# Patient Record
Sex: Male | Born: 1950 | Race: White | Hispanic: No | Marital: Married | Smoking: Former smoker
Health system: Southern US, Community
[De-identification: ages and names within clinical notes are randomized; demographics above are authoritative.]

## PROBLEM LIST (undated history)

## (undated) DIAGNOSIS — J189 Pneumonia, unspecified organism: Secondary | ICD-10-CM

## (undated) DIAGNOSIS — I10 Essential (primary) hypertension: Secondary | ICD-10-CM

## (undated) DIAGNOSIS — D860 Sarcoidosis of lung: Secondary | ICD-10-CM

## (undated) DIAGNOSIS — N400 Enlarged prostate without lower urinary tract symptoms: Secondary | ICD-10-CM

## (undated) HISTORY — DX: Essential (primary) hypertension: I10

## (undated) HISTORY — PX: HIP SURGERY: SHX245

## (undated) HISTORY — PX: JOINT REPLACEMENT: SHX530

---

## 1998-05-25 HISTORY — PX: COLONOSCOPY: SHX174

## 1999-05-26 HISTORY — PX: HERNIA REPAIR: SHX51

## 2014-10-02 DIAGNOSIS — H61813 Exostosis of external canal, bilateral: Secondary | ICD-10-CM | POA: Insufficient documentation

## 2014-10-02 DIAGNOSIS — H9193 Unspecified hearing loss, bilateral: Secondary | ICD-10-CM | POA: Insufficient documentation

## 2015-07-29 DIAGNOSIS — M1611 Unilateral primary osteoarthritis, right hip: Secondary | ICD-10-CM | POA: Insufficient documentation

## 2015-07-29 DIAGNOSIS — Z96641 Presence of right artificial hip joint: Secondary | ICD-10-CM | POA: Insufficient documentation

## 2015-12-21 DIAGNOSIS — N138 Other obstructive and reflux uropathy: Secondary | ICD-10-CM | POA: Insufficient documentation

## 2015-12-21 DIAGNOSIS — R351 Nocturia: Secondary | ICD-10-CM | POA: Insufficient documentation

## 2015-12-21 DIAGNOSIS — N401 Enlarged prostate with lower urinary tract symptoms: Secondary | ICD-10-CM

## 2016-12-16 DIAGNOSIS — D869 Sarcoidosis, unspecified: Secondary | ICD-10-CM | POA: Insufficient documentation

## 2017-06-11 ENCOUNTER — Encounter: Payer: Self-pay | Admitting: Urology

## 2017-06-11 ENCOUNTER — Ambulatory Visit (INDEPENDENT_AMBULATORY_CARE_PROVIDER_SITE_OTHER): Payer: Medicare Other | Admitting: Urology

## 2017-06-11 VITALS — BP 146/82 | HR 97 | Ht 67.0 in | Wt 178.0 lb

## 2017-06-11 DIAGNOSIS — R972 Elevated prostate specific antigen [PSA]: Secondary | ICD-10-CM | POA: Diagnosis not present

## 2017-06-11 NOTE — Progress Notes (Signed)
06/11/2017 1:34 PM   Florestine Avers 12-10-1950 [id number]  Referring provider: Jerl Mina, MD [address] [facility] [place], [place] [address]  Chief Complaint  Patient presents with  . Elevated PSA         HPI: The patient is a 67 year old gentleman who presents today for evaluation of elevated PSA.  His PSA was 4.98 in January 2019.  It was 4.01 in June 2018.  No previous history of elevated PSAs.  No family history of prostate cancer.  Patient voids in a significant voiding issues with his minor nocturia.  No gross hematuria or nephrolithiasis.  He has no complaints at this time.   PMH: Past Medical History:  Diagnosis Date  . Hypertension     Surgical History: None   Home Medications:  Allergies as of 06/11/2017   No Known Allergies     Medication List        Accurate as of 06/11/17  1:34 PM. Always use your most recent med list.          aspirin EC 81 MG tablet Take by mouth.   lisinopril 10 MG tablet Commonly known as:  PRINIVIL,ZESTRIL Take by mouth.       Allergies: No Known Allergies  Family History: No family history on file.  Social History:  reports that he has quit smoking. he has never used smokeless tobacco. He reports that he drinks alcohol. He reports that he does not use drugs.  ROS: UROLOGY Frequent Urination?: Yes Hard to postpone urination?: No Burning/pain with urination?: No Get up at night to urinate?: Yes Leakage of urine?: No Urine stream starts and stops?: No Trouble starting stream?: No Do you have to strain to urinate?: No Blood in urine?: No Urinary tract infection?: No Sexually transmitted disease?: No Injury to kidneys or bladder?: No Painful intercourse?: No Weak stream?: No Erection problems?: No Penile pain?: No  Gastrointestinal Nausea?: No Vomiting?: No Indigestion/heartburn?: No Diarrhea?: No Constipation?: No  Constitutional Fever: No Night sweats?: No Weight loss?:  No Fatigue?: No  Skin Skin rash/lesions?: No Itching?: No  Eyes Blurred vision?: No Double vision?: No  Ears/Nose/Throat Sore throat?: Yes Sinus problems?: Yes  Hematologic/Lymphatic Swollen glands?: No Easy bruising?: No  Cardiovascular Leg swelling?: No Chest pain?: No  Respiratory Cough?: No Shortness of breath?: Yes  Endocrine Excessive thirst?: No  Musculoskeletal Back pain?: No Joint pain?: No  Neurological Headaches?: No Dizziness?: No  Psychologic Depression?: No Anxiety?: No  Physical Exam: BP (!) 146/82   Pulse 97   Ht 5\' 7"  (1.702 m)   Wt 178 lb (80.7 kg)   BMI 27.88 kg/m   Constitutional:  Alert and oriented, No acute distress. HEENT: [place] AT, moist mucus membranes.  Trachea midline, no masses. Cardiovascular: No clubbing, cyanosis, or edema. Respiratory: Normal respiratory effort, no increased work of breathing. GI: Abdomen is soft, nontender, nondistended, no abdominal masses GU: No CVA tenderness.  Normal phallus.  Testicles and equal bilaterally.  Benign.  DRE: 2+ benign Skin: No rashes, bruises or suspicious lesions. Lymph: No cervical or inguinal adenopathy. Neurologic: Grossly intact, no focal deficits, moving all 4 extremities. Psychiatric: Normal mood and affect.  Laboratory Data: No results found for: WBC, HGB, HCT, MCV, PLT  No results found for: CREATININE  No results found for: PSA  No results found for: TESTOSTERONE  No results found for: HGBA1C  Urinalysis No results found for: COLORURINE, APPEARANCEUR, LABSPEC, PHURINE, GLUCOSEU, HGBUR, BILIRUBINUR, KETONESUR, PROTEINUR, UROBILINOGEN, NITRITE, LEUKOCYTESUR  Assessment & Plan:    1.  Elevated PSA I discussed with the patient he has had an elevated PSA on 2 occasions over the last 6 months.  We discussed the next step would be a prostate biopsy.  We discussed the risk, benefits, indications of this procedure.  He understands the risks include but are not limited to  bleeding and infection.  He knows that there will be blood in his urine and stool for 48 hours and his semen for up to 6 weeks.  He does also understand the risk of infection 1% requiring IV antibiotics in the hospital despite periprocedural antibiotics.  All questions were answered.    However, the patient is hesitant to commit to a prostate biopsy at this time.  He has elected instead to monitor his PSA with a repeat PSA in 3 months.  If it continues to rise, he will consider a prostate biopsy at that time  Return in about 3 months (around 09/09/2017) for PSA prior.  Hildred LaserBrian James Sang Blount, MD  [facility] [address], [address] [place], [place] [address] [phone]

## 2017-09-06 ENCOUNTER — Other Ambulatory Visit: Payer: Medicare Other

## 2017-09-06 DIAGNOSIS — R972 Elevated prostate specific antigen [PSA]: Secondary | ICD-10-CM

## 2017-09-07 LAB — PSA: Prostate Specific Ag, Serum: 4.4 ng/mL — ABNORMAL HIGH (ref 0.0–4.0)

## 2017-09-09 ENCOUNTER — Encounter: Payer: Self-pay | Admitting: Urology

## 2017-09-09 ENCOUNTER — Ambulatory Visit (INDEPENDENT_AMBULATORY_CARE_PROVIDER_SITE_OTHER): Payer: Medicare Other | Admitting: Urology

## 2017-09-09 VITALS — BP 138/95 | HR 80 | Ht 67.0 in | Wt 171.8 lb

## 2017-09-09 DIAGNOSIS — R972 Elevated prostate specific antigen [PSA]: Secondary | ICD-10-CM

## 2017-09-09 LAB — URINALYSIS, COMPLETE
Bilirubin, UA: NEGATIVE
GLUCOSE, UA: NEGATIVE
KETONES UA: NEGATIVE
Leukocytes, UA: NEGATIVE
NITRITE UA: NEGATIVE
RBC, UA: NEGATIVE
SPEC GRAV UA: 1.02 (ref 1.005–1.030)
UUROB: 0.2 mg/dL (ref 0.2–1.0)
pH, UA: 5.5 (ref 5.0–7.5)

## 2017-09-09 LAB — MICROSCOPIC EXAMINATION
Epithelial Cells (non renal): NONE SEEN /hpf (ref 0–10)
RBC MICROSCOPIC, UA: NONE SEEN /HPF (ref 0–2)
WBC UA: NONE SEEN /HPF (ref 0–5)

## 2017-09-09 NOTE — Progress Notes (Signed)
09/09/2017 1:11 PM   Wesley Barnes 13-Sep-1950 [id number]  Referring provider: Jerl MinaHedrick, James, MD [address] [facility] [place], [place] [address]  Chief Complaint  Patient presents with  . Elevated PSA    HPI: The patient is a 67 year old gentleman who presents today for evaluation of elevated PSA.  His PSA was 4.98 in January 2019.  It was 4.01 in June 2018.  No previous history of elevated PSAs.  No family history of prostate cancer.  Patient voids in a significant voiding issues with his minor nocturia.  No gross hematuria or nephrolithiasis.  He has no complaints at this time.  Interval: The patient follows up today 3 months after his last visit.  At that visit, the patient opted to continue to trend his PSA rather than proceed with prostate biopsy.  Today, the patient denies any progression of his lower urinary tract symptoms.  He denies any hematuria or dysuria.  He has not had any infections.  He has not had any change.  PSA history:  4.4 on 09/06/17 4.98 on 05/2017   I PSS: 14, Q OL 3 SH IM 24   PMH: Past Medical History:  Diagnosis Date  . Hypertension     Surgical History: None   Home Medications:  Allergies as of 09/09/2017   No Known Allergies     Medication List        Accurate as of 09/09/17  1:11 PM. Always use your most recent med list.          aspirin EC 81 MG tablet Take by mouth.   lisinopril 10 MG tablet Commonly known as:  PRINIVIL,ZESTRIL Take by mouth.   multivitamin capsule Take by mouth.       Allergies: No Known Allergies  Family History: History reviewed. No pertinent family history.  Social History:  reports that he has quit smoking. He has never used smokeless tobacco. He reports that he drinks alcohol. He reports that he does not use drugs.  ROS: UROLOGY Frequent Urination?: Yes Hard to postpone urination?: No Burning/pain with urination?: No Get up at night to urinate?: Yes Leakage of urine?:  No Urine stream starts and stops?: No Trouble starting stream?: No Do you have to strain to urinate?: No Blood in urine?: No Urinary tract infection?: No Sexually transmitted disease?: No Injury to kidneys or bladder?: No Painful intercourse?: No Weak stream?: No Erection problems?: No Penile pain?: No  Gastrointestinal Nausea?: No Vomiting?: No Indigestion/heartburn?: No Diarrhea?: No Constipation?: No  Constitutional Fever: No Night sweats?: No Weight loss?: No Fatigue?: No  Skin Skin rash/lesions?: No Itching?: No  Eyes Blurred vision?: No Double vision?: No  Ears/Nose/Throat Sore throat?: No Sinus problems?: No  Hematologic/Lymphatic Swollen glands?: No Easy bruising?: No  Cardiovascular Leg swelling?: No Chest pain?: No  Respiratory Cough?: No Shortness of breath?: No  Endocrine Excessive thirst?: No  Musculoskeletal Back pain?: No Joint pain?: No  Neurological Headaches?: No Dizziness?: No  Psychologic Depression?: No Anxiety?: No  Physical Exam: BP (!) 138/95 (BP Location: Right Arm, Patient Position: Sitting, Cuff Size: Normal)   Pulse 80   Ht 5\' 7"  (1.702 m)   Wt 77.9 kg (171 lb 12.8 oz)   BMI 26.91 kg/m   Constitutional:  Alert and oriented, No acute distress. HEENT: [place] AT, moist mucus membranes.  Trachea midline, no masses. Cardiovascular: No clubbing, cyanosis, or edema. Respiratory: Normal respiratory effort, no increased work of breathing. GI: Abdomen is soft, nontender, nondistended, no abdominal  masses GU: No CVA tenderness.  Normal phallus.  Testicles and equal bilaterally.  Benign.  DRE: 2+ benign Skin: No rashes, bruises or suspicious lesions. Lymph: No cervical or inguinal adenopathy. Neurologic: Grossly intact, no focal deficits, moving all 4 extremities. Psychiatric: Normal mood and affect.  Laboratory Data: No results found for: WBC, HGB, HCT, MCV, PLT  No results found for: CREATININE  No results found  for: PSA  No results found for: TESTOSTERONE  No results found for: HGBA1C  Urinalysis No results found for: COLORURINE, APPEARANCEUR, LABSPEC, PHURINE, GLUCOSEU, HGBUR, BILIRUBINUR, KETONESUR, PROTEINUR, UROBILINOGEN, NITRITE, LEUKOCYTESUR   Assessment & Plan:    1.  Elevated PSA  The patient and I again discussed the fact that his PSA has remained elevated although it is slightly lower today than it was 3 months ago.  I discussed the implications of an elevated PSA and calculated his risk of prostate cancer based on the prostate cancer prevention trial.  This gives the patient is 7% chance of high-grade prostate cancer, and 18% chance of low-grade prostate cancer, and a 75% chance of having a negative prostate biopsy. I again strongly recommended the patient consider prostate biopsy.  I went through the procedure with him including the risk and the benefits.  The patient again was reluctant, mostly because of the risks of infection associated with prostate biopsy.  I reassured the patient this is a rare circumstance but does happen.  We also discussed the potential of obtaining a prostate MRI which would at least ascertain the risk of high-grade prostate cancer.  Again, the patient would like to follow-up again in 3 months with repeat PSA.  At that point, if it remains elevated he would schedule his prostate biopsy. Return in about 3 months (around 12/23/2017).  Crist Fat, MD  [facility] [address] [place], [place] [address] [phone]

## 2018-01-10 ENCOUNTER — Other Ambulatory Visit: Payer: Self-pay | Admitting: Specialist

## 2018-01-10 DIAGNOSIS — R0602 Shortness of breath: Secondary | ICD-10-CM

## 2018-01-18 ENCOUNTER — Ambulatory Visit (INDEPENDENT_AMBULATORY_CARE_PROVIDER_SITE_OTHER): Payer: Medicare Other | Admitting: Urology

## 2018-01-18 ENCOUNTER — Encounter: Payer: Self-pay | Admitting: Urology

## 2018-01-18 VITALS — BP 156/99 | HR 99 | Ht 67.0 in | Wt 174.2 lb

## 2018-01-18 DIAGNOSIS — R972 Elevated prostate specific antigen [PSA]: Secondary | ICD-10-CM | POA: Diagnosis not present

## 2018-01-18 DIAGNOSIS — I10 Essential (primary) hypertension: Secondary | ICD-10-CM | POA: Insufficient documentation

## 2018-01-18 NOTE — Progress Notes (Signed)
01/18/2018 3:02 PM   Florestine AversMarc Potash 12-14-1950 [id number]  Referring provider: Jerl MinaHedrick, James, MD [address] [facility] [place], [place] [address]  Chief Complaint  Patient presents with  . Elevated PSA   Urologic history: 1.  Elevated PSA - On surveillance - PSA results as follows:  10/2016   4.01  05/2017   4.98  08/2017   4.4  12/2017   4.6846  HPI: 67 year old male presents for follow-up of an elevated PSA.  Initial PSA by his PCP was in June 2018.  He states his PSA has been checked prior to that time and was always in the low 4 range.  He has not had a prior biopsy.  PSA performed at Dr. Webb SilversmithHedrick's office last week was 4.46.  He has stable lower urinary tract symptoms including nocturia x3-4 which is not bothersome   PMH: Past Medical History:  Diagnosis Date  . Hypertension     Surgical History: Past Surgical History:  Procedure Laterality Date  . COLONOSCOPY  2000  . HERNIA REPAIR  2001  . HIP SURGERY Right    2016    Home Medications:  Allergies as of 01/18/2018   No Known Allergies     Medication List        Accurate as of 01/18/18  3:02 PM. Always use your most recent med list.          ADVAIR HFA 115-21 MCG/ACT inhaler Generic drug:  fluticasone-salmeterol Inhale into the lungs.   aspirin EC 81 MG tablet Take by mouth.   lisinopril 10 MG tablet Commonly known as:  PRINIVIL,ZESTRIL Take by mouth.   multivitamin capsule Take by mouth.   triamcinolone acetonide 40 MG/ML Susp Commonly known as:  TRIESENCE Inject into the articular space.       Allergies: No Known Allergies  Family History: History reviewed. No pertinent family history.  Social History:  reports that he has quit smoking. He has never used smokeless tobacco. He reports that he drinks alcohol. He reports that he does not use drugs.  ROS: UROLOGY Frequent Urination?: Yes Hard to postpone urination?: No Burning/pain with urination?: No Get up at night to  urinate?: Yes Leakage of urine?: No Urine stream starts and stops?: No Trouble starting stream?: No Do you have to strain to urinate?: No Blood in urine?: No Urinary tract infection?: No Sexually transmitted disease?: No Injury to kidneys or bladder?: No Painful intercourse?: No Weak stream?: No Erection problems?: No Penile pain?: No  Gastrointestinal Nausea?: No Vomiting?: No Indigestion/heartburn?: No Diarrhea?: No Constipation?: No  Constitutional Fever: No Night sweats?: No Weight loss?: No Fatigue?: No  Skin Skin rash/lesions?: No Itching?: No  Eyes Blurred vision?: No Double vision?: No  Ears/Nose/Throat Sore throat?: No Sinus problems?: No  Hematologic/Lymphatic Swollen glands?: No Easy bruising?: No  Cardiovascular Leg swelling?: No Chest pain?: No  Respiratory Cough?: No Shortness of breath?: No  Endocrine Excessive thirst?: No  Musculoskeletal Back pain?: No Joint pain?: No  Neurological Headaches?: No Dizziness?: No  Psychologic Depression?: No Anxiety?: No  Physical Exam: BP (!) 156/99 (BP Location: Left Arm, Patient Position: Sitting, Cuff Size: Normal)   Pulse 99   Ht 5\' 7"  (1.702 m)   Wt 174 lb 3.2 oz (79 kg)   BMI 27.28 kg/m   Constitutional:  Alert and oriented, No acute distress. HEENT:  AT, moist mucus membranes.  Trachea midline, no masses. Cardiovascular: No clubbing, cyanosis, or edema. Respiratory: Normal respiratory effort, no increased work  of breathing. GI: Abdomen is soft, nontender, nondistended, no abdominal masses GU: No CVA tenderness.  Prostate 60 g, smooth without nodules Lymph: No cervical or inguinal lymphadenopathy. Skin: No rashes, bruises or suspicious lesions. Neurologic: Grossly intact, no focal deficits, moving all 4 extremities. Psychiatric: Normal mood and affect.   Assessment & Plan:   67 year old male with a mild PSA elevation and benign DRE.  His PSA is stable.  Although PSA is a  prostate cancer screening test he was informed that cancer is not the most common cause of an elevated PSA. Other potential causes including BPH and inflammation were discussed. He was informed that the only way to adequately diagnose prostate cancer would be a transrectal ultrasound and biopsy of the prostate. The procedure was discussed including potential risks of bleeding and infection/sepsis. He was also informed that a negative biopsy does not conclusively rule out the possibility that prostate cancer may be present and that continued monitoring is required. The use of newer adjunctive blood tests including PHI and 4kScore were discussed. The use of multiparametric prostate MRI was also discussed however is not typically used for initial evaluation of an elevated PSA. Continued periodic surveillance was also discussed.  He has elected to continue surveillance and will follow-up in 6 months.  He would like to perform a 4K in lieu of PSA at his next visit.  Riki Altes, MD  [facility] [address] [place], [place] [address] [phone]

## 2018-01-20 ENCOUNTER — Ambulatory Visit: Payer: Medicare Other | Attending: Specialist

## 2018-01-23 ENCOUNTER — Encounter: Payer: Self-pay | Admitting: Urology

## 2018-05-25 DIAGNOSIS — J189 Pneumonia, unspecified organism: Secondary | ICD-10-CM

## 2018-05-25 HISTORY — DX: Pneumonia, unspecified organism: J18.9

## 2018-05-31 ENCOUNTER — Emergency Department
Admission: EM | Admit: 2018-05-31 | Discharge: 2018-05-31 | Disposition: A | Discharge status: Home / Self Care | Payer: Medicare Other | Attending: Emergency Medicine | Admitting: Emergency Medicine

## 2018-05-31 ENCOUNTER — Encounter: Payer: Self-pay | Admitting: Emergency Medicine

## 2018-05-31 ENCOUNTER — Emergency Department: Payer: Medicare Other

## 2018-05-31 DIAGNOSIS — R05 Cough: Secondary | ICD-10-CM | POA: Diagnosis present

## 2018-05-31 DIAGNOSIS — Z79899 Other long term (current) drug therapy: Secondary | ICD-10-CM | POA: Diagnosis not present

## 2018-05-31 DIAGNOSIS — J189 Pneumonia, unspecified organism: Secondary | ICD-10-CM | POA: Insufficient documentation

## 2018-05-31 DIAGNOSIS — Z7982 Long term (current) use of aspirin: Secondary | ICD-10-CM | POA: Insufficient documentation

## 2018-05-31 DIAGNOSIS — Z87891 Personal history of nicotine dependence: Secondary | ICD-10-CM | POA: Diagnosis not present

## 2018-05-31 DIAGNOSIS — I10 Essential (primary) hypertension: Secondary | ICD-10-CM | POA: Insufficient documentation

## 2018-05-31 DIAGNOSIS — J181 Lobar pneumonia, unspecified organism: Secondary | ICD-10-CM

## 2018-05-31 LAB — URINALYSIS, COMPLETE (UACMP) WITH MICROSCOPIC
BACTERIA UA: NONE SEEN
Bilirubin Urine: NEGATIVE
Glucose, UA: NEGATIVE mg/dL
Hgb urine dipstick: NEGATIVE
Ketones, ur: 5 mg/dL — AB
Leukocytes, UA: NEGATIVE
Nitrite: NEGATIVE
PROTEIN: 30 mg/dL — AB
Specific Gravity, Urine: 1.025 (ref 1.005–1.030)
pH: 6 (ref 5.0–8.0)

## 2018-05-31 LAB — CBC
HCT: 37.4 % — ABNORMAL LOW (ref 39.0–52.0)
Hemoglobin: 12.3 g/dL — ABNORMAL LOW (ref 13.0–17.0)
MCH: 30.4 pg (ref 26.0–34.0)
MCHC: 32.9 g/dL (ref 30.0–36.0)
MCV: 92.6 fL (ref 80.0–100.0)
Platelets: 313 10*3/uL (ref 150–400)
RBC: 4.04 MIL/uL — ABNORMAL LOW (ref 4.22–5.81)
RDW: 13.4 % (ref 11.5–15.5)
WBC: 16.5 10*3/uL — ABNORMAL HIGH (ref 4.0–10.5)
nRBC: 0 % (ref 0.0–0.2)

## 2018-05-31 LAB — BASIC METABOLIC PANEL
Anion gap: 8 (ref 5–15)
BUN: 19 mg/dL (ref 8–23)
CO2: 26 mmol/L (ref 22–32)
Calcium: 8.6 mg/dL — ABNORMAL LOW (ref 8.9–10.3)
Chloride: 100 mmol/L (ref 98–111)
Creatinine, Ser: 0.87 mg/dL (ref 0.61–1.24)
GFR calc Af Amer: >60 mL/min (ref >60)
Glucose, Bld: 115 mg/dL — ABNORMAL HIGH (ref 70–99)
Potassium: 3.7 mmol/L (ref 3.5–5.1)
Sodium: 134 mmol/L — ABNORMAL LOW (ref 135–145)

## 2018-05-31 LAB — CK: Total CK: 39 U/L — ABNORMAL LOW (ref 49–397)

## 2018-05-31 LAB — TROPONIN I: Troponin I: <0.03 ng/mL (ref <0.03)

## 2018-05-31 LAB — INFLUENZA PANEL BY PCR (TYPE A & B)
INFLAPCR: NEGATIVE
Influenza B By PCR: NEGATIVE

## 2018-05-31 MED ORDER — BENZONATATE 100 MG PO CAPS: 100.0000 mg | ORAL_CAPSULE | Freq: Three times a day (TID) | ORAL | 0 refills | Status: DC | PRN

## 2018-05-31 MED ORDER — ALBUTEROL SULFATE (2.5 MG/3ML) 0.083% IN NEBU: 2.5000 mg | INHALATION_SOLUTION | Freq: Four times a day (QID) | RESPIRATORY_TRACT | 12 refills | Status: DC | PRN

## 2018-05-31 MED ORDER — AZITHROMYCIN 500 MG PO TABS
500.0000 mg | ORAL_TABLET | Freq: Once | ORAL | Status: AC
  Administered 2018-05-31: 500 mg via ORAL
  Filled 2018-05-31: qty 1

## 2018-05-31 MED ORDER — COMPRESSOR/NEBULIZER MISC: 1.0000 | Freq: Once | 0 refills | Status: DC

## 2018-05-31 MED ORDER — IPRATROPIUM-ALBUTEROL 0.5-2.5 (3) MG/3ML IN SOLN
3.0000 mL | Freq: Once | RESPIRATORY_TRACT | Status: AC
  Administered 2018-05-31: 3 mL via RESPIRATORY_TRACT
  Filled 2018-05-31: qty 3

## 2018-05-31 MED ORDER — AZITHROMYCIN 250 MG PO TABS: ORAL_TABLET | ORAL | 0 refills | Status: AC

## 2018-05-31 MED ORDER — COMPRESSOR/NEBULIZER MISC: 1.0000 | Freq: Once | 0 refills | Status: AC

## 2018-05-31 NOTE — ED Provider Notes (Signed)
[facility] Emergency Department Provider Note ___   First MD Initiated Contact with Patient 05/31/18 1358     (approximate)  I have reviewed the triage vital signs and the nursing notes.   HISTORY  Chief Complaint Cough; Shortness of Breath; Chest Pain; and Fever    HPI Wesley Barnes is a 68 y.o. male presents to the emergency department with a one-week history of adductive cough congestion subjective fevers chills and dyspnea.  Patient denies any known sick contact.  Patient does admit to receiving flu vaccine this year.  Patient also admits to receiving his pneumonia vacs recently as well.   Past Medical History:  Diagnosis Date  . Hypertension     Patient Active Problem List   Diagnosis Date Noted  . Hypertension 01/18/2018  . Sarcoidosis 12/16/2016  . 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 Nocturia 12/21/2015  . Osteoarthritis of right hip 07/29/2015  . 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 Hearing loss of both ears 10/02/2014    Past Surgical History:  Procedure Laterality Date  . COLONOSCOPY  2000  . HERNIA REPAIR  2001  . HIP SURGERY Right    2016    Prior to Admission medications   Medication Sig Start Date End Date Taking? Authorizing Provider  albuterol (PROVENTIL) (2.5 MG/3ML) 0.083% nebulizer solution Take 3 mLs (2.5 mg total) by nebulization every 6 (six) hours as needed for wheezing or shortness of breath. 05/31/18   Darci Current, MD  aspirin EC 81 MG tablet Take by mouth. 07/30/15   [provider]  azithromycin (ZITHROMAX Z-PAK) 250 MG tablet Take 2 tablets (500 mg) on  Day 1,  followed by 1 tablet (250 mg) once daily on Days 2 through 5. 05/31/18 06/05/18  Darci Current, MD  benzonatate (TESSALON PERLES) 100 MG capsule Take 1 capsule (100 mg total) by mouth 3 (three) times daily as needed for cough. 05/31/18 05/31/19  Darci Current, MD  fluticasone-salmeterol (ADVAIR HFA) [phone] MCG/ACT inhaler Inhale into the lungs. 09/23/15   [provider]  lisinopril (PRINIVIL,ZESTRIL) 10 MG tablet Take by mouth. 06/08/16   [provider]  Multiple Vitamin (MULTIVITAMIN) capsule Take by mouth.    [provider]  Nebulizers (COMPRESSOR/NEBULIZER) MISC 1 Device by Does not apply route once for 1 dose. 05/31/18 05/31/18  Darci Current, MD  triamcinolone acetonide (TRIESENCE) 40 MG/ML SUSP Inject into the articular space. 11/19/17   [provider]    Allergies Patient has no known allergies.  No family history on file.  Social History Social History   Tobacco Use  . Smoking status: Former Games developer  . Smokeless tobacco: Never Used  Substance Use Topics  . Alcohol use: Yes  . Drug use: No    Review of Systems Constitutional: Positive for fever/chills Eyes: No visual changes. ENT: No sore throat. Cardiovascular: Denies chest pain. Respiratory: Denies shortness of breath.  Positive for cough Gastrointestinal: No abdominal pain.  No nausea, no vomiting.  No diarrhea.  No constipation. Genitourinary: Negative for dysuria. Musculoskeletal: Negative for neck pain.  Negative for back pain. Integumentary: Negative for rash. Neurological: Negative for headaches, focal weakness or numbness.  ____________________________________________   PHYSICAL EXAM:  VITAL SIGNS: ED Triage Vitals  Enc Vitals Group     BP 05/31/18 1139 (!) 155/92     Pulse Rate 05/31/18 1139 (!) 133     Resp  05/31/18 1358 (!) 30     Temp 05/31/18 1139 (!) 100.7 F (38.2 C)     Temp Source 05/31/18 1139 Oral     SpO2 05/31/18 1139 98 %     Weight 05/31/18 1134 79.4 kg (175 lb)     Height 05/31/18 1134 1.727 m (5\' 8" )     Head Circumference --      Peak Flow --      Pain Score 05/31/18 1134 4     Pain Loc --      Pain Edu? --      Excl. in GC? --     Constitutional: Alert and oriented. Well appearing  and in no acute distress. Eyes: Conjunctivae are normal.  Mouth/Throat: Mucous membranes are moist.  Oropharynx non-erythematous. Neck: No stridor.   Cardiovascular: Normal rate, regular rhythm. Good peripheral circulation. Grossly normal heart sounds. Respiratory: Normal respiratory effort.  No retractions.  Right  upper lobe rhonchi  gastrointestinal: Soft and nontender. No distention.  Musculoskeletal: No lower extremity tenderness nor edema. No gross deformities of extremities. Neurologic:  Normal speech and language. No gross focal neurologic deficits are appreciated.  Skin:  Skin is warm, dry and intact. No rash noted.  ____________________________________________   LABS (all labs ordered are listed, but only abnormal results are displayed)  Labs Reviewed  BASIC METABOLIC PANEL - Abnormal; Notable for the following components:      Result Value   Sodium 134 (*)    Glucose, Bld 115 (*)    Calcium 8.6 (*)    All other components within normal limits  CBC - Abnormal; Notable for the following components:   WBC 16.5 (*)    RBC 4.04 (*)    Hemoglobin 12.3 (*)    HCT 37.4 (*)    All other components within normal limits  URINALYSIS, COMPLETE (UACMP) WITH MICROSCOPIC - Abnormal; Notable for the following components:   Color, Urine AMBER (*)    APPearance CLEAR (*)    Ketones, ur 5 (*)    Protein, ur 30 (*)    All other components within normal limits  CK - Abnormal; Notable for the following components:   Total CK 39 (*)    All other components within normal limits  TROPONIN I  INFLUENZA PANEL BY PCR (TYPE A & B)   ____________________________________________  EKG  ED ECG REPORT I, Sigurd N BROWN, the attending physician, personally viewed and interpreted this ECG.   Date: 05/31/2018  EKG Time: 11:42 AM  Rate: 128  Rhythm: Sinus tachycardia   Axis: Normal  Intervals: Normal  ST&T Change: None  ____________________________________________  RADIOLOGY I,   N BROWN, personally viewed and evaluated these images (plain radiographs) as part of my medical decision making, as well as reviewing the written report by the radiologist.  ED MD interpretation: Right upper lobe pneumonia noted on chest x-ray.  Official radiology report(s): Dg Chest 2 View  Result Date: 05/31/2018 CLINICAL DATA:  Shortness of breath, chest pain EXAM: CHEST - 2 VIEW COMPARISON:  08/02/2016 FINDINGS: There is right upper lobe airspace disease most concerning for pneumonia. The lungs are hyperinflated likely secondary to COPD. There is no pleural effusion or pneumothorax. The heart and mediastinal contours are unremarkable. The osseous structures are unremarkable. IMPRESSION: Right upper lobe pneumonia. Followup PA and lateral chest X-ray is recommended in 3-4 weeks following trial of antibiotic therapy to ensure resolution and exclude underlying malignancy. Electronically Signed   By: Elige KoHetal  Patel   On: 05/31/2018  12:44     Procedures   ____________________________________________   INITIAL IMPRESSION / ASSESSMENT AND PLAN / ED COURSE  As part of my medical decision making, I reviewed the following data within the electronic MEDICAL RECORD NUMBER 68 year old male presenting with above-stated history and physical exam consistent with pneumonia versus influenza.  Chest x-ray consistent with a right upper lobe pneumonia.  Patient given azithromycin in the emergency department and will be prescribed same for home as well as Tessalon and albuterol.  Spoke with the patient and his wife at length regarding warning signs that would warrant immediate return to the emergency department.  ___________  FINAL CLINICAL IMPRESSION(S) / ED DIAGNOSES  Final diagnoses:  Community acquired pneumonia of right upper lobe of lung (HCC)     MEDICATIONS GIVEN DURING THIS VISIT:  Medications  azithromycin (ZITHROMAX) tablet 500 mg (500 mg Oral Given 05/31/18 1421)  ipratropium-albuterol  (DUONEB) 0.5-2.5 (3) MG/3ML nebulizer solution 3 mL (3 mLs Nebulization Given 05/31/18 1422)     ED Discharge Orders         Ordered    azithromycin (ZITHROMAX Z-PAK) 250 MG tablet     05/31/18 1550    benzonatate (TESSALON PERLES) 100 MG capsule  3 times daily PRN     05/31/18 1550    albuterol (PROVENTIL) (2.5 MG/3ML) 0.083% nebulizer solution  Every 6 hours PRN     05/31/18 1550    Nebulizers (COMPRESSOR/NEBULIZER) MISC   Once     05/31/18 1550           Note:  This document was prepared using Dragon voice recognition software and may include unintentional dictation errors.    Darci CurrentBrown, [place] N, MD 05/31/18 [phone]

## 2018-05-31 NOTE — ED Triage Notes (Signed)
Pt reports upper resp sx's since New Years Day, fevers, cough, congestion and SOB. Pt had hip replacement on 12/2.

## 2018-05-31 NOTE — ED Notes (Signed)
Gave pt urine cup with bag.  

## 2018-06-09 ENCOUNTER — Other Ambulatory Visit: Payer: Self-pay

## 2018-06-09 ENCOUNTER — Inpatient Hospital Stay
Admission: EM | Admit: 2018-06-09 | Discharge: 2018-06-11 | DRG: 683 | Disposition: A | Discharge status: Home / Self Care | Payer: Medicare Other | Attending: Internal Medicine | Admitting: Internal Medicine

## 2018-06-09 ENCOUNTER — Emergency Department: Payer: Medicare Other

## 2018-06-09 ENCOUNTER — Encounter: Payer: Self-pay | Admitting: *Deleted

## 2018-06-09 DIAGNOSIS — R339 Retention of urine, unspecified: Secondary | ICD-10-CM

## 2018-06-09 DIAGNOSIS — N401 Enlarged prostate with lower urinary tract symptoms: Secondary | ICD-10-CM | POA: Diagnosis present

## 2018-06-09 DIAGNOSIS — Z7982 Long term (current) use of aspirin: Secondary | ICD-10-CM | POA: Diagnosis not present

## 2018-06-09 DIAGNOSIS — N1330 Unspecified hydronephrosis: Secondary | ICD-10-CM | POA: Diagnosis present

## 2018-06-09 DIAGNOSIS — E871 Hypo-osmolality and hyponatremia: Secondary | ICD-10-CM | POA: Diagnosis present

## 2018-06-09 DIAGNOSIS — N179 Acute kidney failure, unspecified: Principal | ICD-10-CM | POA: Diagnosis present

## 2018-06-09 DIAGNOSIS — M1610 Unilateral primary osteoarthritis, unspecified hip: Secondary | ICD-10-CM | POA: Diagnosis present

## 2018-06-09 DIAGNOSIS — Z8701 Personal history of pneumonia (recurrent): Secondary | ICD-10-CM | POA: Diagnosis not present

## 2018-06-09 DIAGNOSIS — Z8249 Family history of ischemic heart disease and other diseases of the circulatory system: Secondary | ICD-10-CM

## 2018-06-09 DIAGNOSIS — R338 Other retention of urine: Secondary | ICD-10-CM

## 2018-06-09 DIAGNOSIS — Z79899 Other long term (current) drug therapy: Secondary | ICD-10-CM

## 2018-06-09 DIAGNOSIS — I10 Essential (primary) hypertension: Secondary | ICD-10-CM | POA: Diagnosis present

## 2018-06-09 DIAGNOSIS — R972 Elevated prostate specific antigen [PSA]: Secondary | ICD-10-CM | POA: Diagnosis present

## 2018-06-09 DIAGNOSIS — D869 Sarcoidosis, unspecified: Secondary | ICD-10-CM | POA: Diagnosis present

## 2018-06-09 DIAGNOSIS — Z87891 Personal history of nicotine dependence: Secondary | ICD-10-CM | POA: Diagnosis not present

## 2018-06-09 DIAGNOSIS — E875 Hyperkalemia: Secondary | ICD-10-CM | POA: Diagnosis present

## 2018-06-09 DIAGNOSIS — Z7951 Long term (current) use of inhaled steroids: Secondary | ICD-10-CM | POA: Diagnosis not present

## 2018-06-09 DIAGNOSIS — K5900 Constipation, unspecified: Secondary | ICD-10-CM | POA: Diagnosis present

## 2018-06-09 LAB — BASIC METABOLIC PANEL
Anion gap: 16 — ABNORMAL HIGH (ref 5–15)
BUN: 51 mg/dL — AB (ref 8–23)
CO2: 19 mmol/L — ABNORMAL LOW (ref 22–32)
Calcium: 8.6 mg/dL — ABNORMAL LOW (ref 8.9–10.3)
Chloride: 87 mmol/L — ABNORMAL LOW (ref 98–111)
Creatinine, Ser: 5.34 mg/dL — ABNORMAL HIGH (ref 0.61–1.24)
GFR calc Af Amer: 12 mL/min — ABNORMAL LOW (ref >60)
GFR calc non Af Amer: 10 mL/min — ABNORMAL LOW (ref >60)
GLUCOSE: 118 mg/dL — AB (ref 70–99)
Potassium: 5.4 mmol/L — ABNORMAL HIGH (ref 3.5–5.1)
Sodium: 122 mmol/L — ABNORMAL LOW (ref 135–145)

## 2018-06-09 LAB — URINALYSIS, COMPLETE (UACMP) WITH MICROSCOPIC
Bacteria, UA: NONE SEEN
Bilirubin Urine: NEGATIVE
Glucose, UA: NEGATIVE mg/dL
HGB URINE DIPSTICK: NEGATIVE
Ketones, ur: NEGATIVE mg/dL
Leukocytes, UA: NEGATIVE
Nitrite: NEGATIVE
Protein, ur: NEGATIVE mg/dL
Specific Gravity, Urine: 1.011 (ref 1.005–1.030)
Squamous Epithelial / HPF: NONE SEEN (ref 0–5)
pH: 7 (ref 5.0–8.0)

## 2018-06-09 LAB — CBC
HCT: 37.6 % — ABNORMAL LOW (ref 39.0–52.0)
HEMOGLOBIN: 12.9 g/dL — AB (ref 13.0–17.0)
MCH: 31.2 pg (ref 26.0–34.0)
MCHC: 34.3 g/dL (ref 30.0–36.0)
MCV: 90.8 fL (ref 80.0–100.0)
Platelets: 473 10*3/uL — ABNORMAL HIGH (ref 150–400)
RBC: 4.14 MIL/uL — ABNORMAL LOW (ref 4.22–5.81)
RDW: 13.3 % (ref 11.5–15.5)
WBC: 17.8 10*3/uL — ABNORMAL HIGH (ref 4.0–10.5)
nRBC: 0 % (ref 0.0–0.2)

## 2018-06-09 LAB — OSMOLALITY, URINE: Osmolality, Ur: 419 mOsm/kg (ref 300–900)

## 2018-06-09 LAB — SODIUM
SODIUM: 136 mmol/L (ref 135–145)
Sodium: 134 mmol/L — ABNORMAL LOW (ref 135–145)

## 2018-06-09 LAB — SODIUM, URINE, RANDOM: Sodium, Ur: 64 mmol/L

## 2018-06-09 MED ORDER — ENOXAPARIN SODIUM 30 MG/0.3ML ~~LOC~~ SOLN: 30.0000 mg | SUBCUTANEOUS | Status: DC

## 2018-06-09 MED ORDER — IOPAMIDOL (ISOVUE-300) INJECTION 61%: 30.0000 mL | Freq: Once | INTRAVENOUS | Status: DC | PRN

## 2018-06-09 MED ORDER — POLYETHYLENE GLYCOL 3350 17 G PO PACK
17.0000 g | PACK | Freq: Every day | ORAL | Status: DC
  Administered 2018-06-09 – 2018-06-11 (×2): 17 g via ORAL
  Filled 2018-06-09 (×3): qty 1

## 2018-06-09 MED ORDER — PATIROMER SORBITEX CALCIUM 8.4 G PO PACK: 8.4000 g | PACK | Freq: Every day | ORAL | Status: DC

## 2018-06-09 MED ORDER — ONDANSETRON HCL 4 MG PO TABS: 4.0000 mg | ORAL_TABLET | Freq: Four times a day (QID) | ORAL | Status: DC | PRN

## 2018-06-09 MED ORDER — TRAMADOL HCL 50 MG PO TABS: 50.0000 mg | ORAL_TABLET | Freq: Four times a day (QID) | ORAL | Status: DC | PRN

## 2018-06-09 MED ORDER — HEPARIN SODIUM (PORCINE) 5000 UNIT/ML IJ SOLN
5000.0000 [IU] | Freq: Three times a day (TID) | INTRAMUSCULAR | Status: DC
  Administered 2018-06-09 – 2018-06-10 (×2): 5000 [IU] via SUBCUTANEOUS
  Filled 2018-06-09 (×2): qty 1

## 2018-06-09 MED ORDER — ACETAMINOPHEN 325 MG PO TABS: 650.0000 mg | ORAL_TABLET | Freq: Four times a day (QID) | ORAL | Status: DC | PRN

## 2018-06-09 MED ORDER — ALBUTEROL SULFATE (2.5 MG/3ML) 0.083% IN NEBU: 2.5000 mg | INHALATION_SOLUTION | Freq: Four times a day (QID) | RESPIRATORY_TRACT | Status: DC | PRN

## 2018-06-09 MED ORDER — PATIROMER SORBITEX CALCIUM 8.4 G PO PACK
8.4000 g | PACK | Freq: Once | ORAL | Status: AC
  Administered 2018-06-09: 8.4 g via ORAL
  Filled 2018-06-09: qty 1

## 2018-06-09 MED ORDER — ADULT MULTIVITAMIN W/MINERALS CH
1.0000 | ORAL_TABLET | Freq: Every day | ORAL | Status: DC
  Administered 2018-06-10 – 2018-06-11 (×2): 1 via ORAL
  Filled 2018-06-09 (×2): qty 1

## 2018-06-09 MED ORDER — ACETAMINOPHEN 650 MG RE SUPP: 650.0000 mg | Freq: Four times a day (QID) | RECTAL | Status: DC | PRN

## 2018-06-09 MED ORDER — SODIUM CHLORIDE 0.9 % IV SOLN
INTRAVENOUS | Status: DC
  Administered 2018-06-09 – 2018-06-10 (×2): via INTRAVENOUS

## 2018-06-09 MED ORDER — MOMETASONE FURO-FORMOTEROL FUM 200-5 MCG/ACT IN AERO
2.0000 | INHALATION_SPRAY | Freq: Two times a day (BID) | RESPIRATORY_TRACT | Status: DC
  Filled 2018-06-09: qty 8.8

## 2018-06-09 MED ORDER — ONDANSETRON HCL 4 MG/2ML IJ SOLN: 4.0000 mg | Freq: Four times a day (QID) | INTRAMUSCULAR | Status: DC | PRN

## 2018-06-09 MED ORDER — TAMSULOSIN HCL 0.4 MG PO CAPS
0.4000 mg | ORAL_CAPSULE | Freq: Every day | ORAL | Status: DC
  Administered 2018-06-09 – 2018-06-11 (×3): 0.4 mg via ORAL
  Filled 2018-06-09 (×3): qty 1

## 2018-06-09 MED ORDER — SODIUM CHLORIDE 0.9 % IV SOLN
1000.0000 mL | Freq: Once | INTRAVENOUS | Status: AC
  Administered 2018-06-09: 1000 mL via INTRAVENOUS

## 2018-06-09 MED ORDER — MULTIVITAMINS PO CAPS: 1.0000 | ORAL_CAPSULE | Freq: Every day | ORAL | Status: DC

## 2018-06-09 MED ORDER — ASPIRIN EC 81 MG PO TBEC
81.0000 mg | DELAYED_RELEASE_TABLET | Freq: Every day | ORAL | Status: DC
  Administered 2018-06-10 – 2018-06-11 (×2): 81 mg via ORAL
  Filled 2018-06-09 (×2): qty 1

## 2018-06-09 NOTE — ED Notes (Signed)
ED TO INPATIENT HANDOFF REPORT  Name/Age/Gender Wesley Barnes 68 y.o. male  Code Status   Home/SNF/Other From Home   Chief Complaint abd pain  Level of Care/Admitting Diagnosis ED Disposition    ED Disposition Condition Comment   Admit  Hospital Area: [facility] [[id number]]  Level of Care: Med-Surg [16]  Diagnosis: AKI (acute kidney injury) [facility]) [[id number]]  Admitting Physician: Ramonita Lab [[id number]]  Attending Physician: Ramonita Lab [[id number]]  Estimated length of stay: 3 - 4 days  Certification:: I certify this patient will need inpatient services for at least 2 midnights  Bed request comments: 2c  PT Class (Do Not Modify): Inpatient [101]  PT Acc Code (Do Not Modify): Private [1]       Medical History Past Medical History:  Diagnosis Date  . Hypertension     Allergies No Known Allergies  IV Location/Drains/Wounds Patient Lines/Drains/Airways Status   Active Line/Drains/Airways    Name:   Placement date:   Placement time:   Site:   Days:   Peripheral IV 06/09/18 Left Antecubital   06/09/18    0949    Antecubital   less than 1   Urethral Catheter Tom RN Double-lumen 16 Fr.   06/09/18    1241    Double-lumen   less than 1          Labs/Imaging Results for orders placed or performed during the hospital encounter of 06/09/18 (from the past 48 hour(s))  Basic metabolic panel     Status: Abnormal   Collection Time: 06/09/18  9:48 AM  Result Value Ref Range   Sodium 122 (L) 135 - 145 mmol/L   Potassium 5.4 (H) 3.5 - 5.1 mmol/L   Chloride 87 (L) 98 - 111 mmol/L   CO2 19 (L) 22 - 32 mmol/L   Glucose, Bld 118 (H) 70 - 99 mg/dL   BUN 51 (H) 8 - 23 mg/dL   Creatinine, Ser 9.98 (H) 0.61 - 1.24 mg/dL   Calcium 8.6 (L) 8.9 - 10.3 mg/dL   GFR calc non Af Amer 10 (L) >60 mL/min   GFR calc Af Amer 12 (L) >60 mL/min   Anion gap 16 (H) 5 - 15    Comment: Performed at [facility], [address] [place], [place] [address]  CBC     Status:  Abnormal   Collection Time: 06/09/18  9:48 AM  Result Value Ref Range   WBC 17.8 (H) 4.0 - 10.5 K/uL   RBC 4.14 (L) 4.22 - 5.81 MIL/uL   Hemoglobin 12.9 (L) 13.0 - 17.0 g/dL   HCT 05.3 (L) 97.6 - 73.4 %   MCV 90.8 80.0 - 100.0 fL   MCH 31.2 26.0 - 34.0 pg   MCHC 34.3 30.0 - 36.0 g/dL   RDW 19.3 79.0 - 24.0 %   Platelets 473 (H) 150 - 400 K/uL   nRBC 0.0 0.0 - 0.2 %    Comment: Performed at [facility], [address] [place], [place] [address]  Urinalysis, Complete w Microscopic     Status: Abnormal   Collection Time: 06/09/18 12:46 PM  Result Value Ref Range   Color, Urine YELLOW (A) YELLOW   APPearance CLEAR (A) CLEAR   Specific Gravity, Urine 1.011 1.005 - 1.030   pH 7.0 5.0 - 8.0   Glucose, UA NEGATIVE NEGATIVE mg/dL   Hgb urine dipstick NEGATIVE NEGATIVE   Bilirubin Urine NEGATIVE NEGATIVE   Ketones, ur NEGATIVE NEGATIVE mg/dL   Protein, ur  NEGATIVE NEGATIVE mg/dL   Nitrite NEGATIVE NEGATIVE   Leukocytes, UA NEGATIVE NEGATIVE   RBC / HPF 0-5 0 - 5 RBC/hpf   WBC, UA 0-5 0 - 5 WBC/hpf   Bacteria, UA NONE SEEN NONE SEEN   Squamous Epithelial / LPF NONE SEEN 0 - 5   Mucus PRESENT     Comment: Performed at [facility], [address], [place], [place] [address]   Ct Abdomen Pelvis Wo Contrast  Result Date: 06/09/2018 CLINICAL DATA:  Difficulty voiding EXAM: CT ABDOMEN AND PELVIS WITHOUT CONTRAST TECHNIQUE: Multidetector CT imaging of the abdomen and pelvis was performed following the standard protocol without IV contrast. COMPARISON:  11/19/2013 FINDINGS: Lower chest: No acute abnormality. Hepatobiliary: No focal liver abnormality is seen. No gallstones, gallbladder wall thickening, or biliary dilatation. Pancreas: Unremarkable. No pancreatic ductal dilatation or surrounding inflammatory changes. Spleen: Spleen is within normal limits. Mild perisplenic fluid is noted. Adrenals/Urinary Tract: Adrenal glands are within normal limits. Left kidney demonstrates  no renal calculi or obstructive changes. The ureter appears within normal limits. The right kidney demonstrates some perinephric changes as well as fullness of the collecting system. Fluid is noted tracking along the course of the right ureter and kidney towards the bladder. The right ureter is distended to the level of the urinary bladder. The bladder is decompressed by Foley catheter. Stomach/Bowel: No obstructive or inflammatory changes of the bowel are noted. The appendix is within normal limits. The stomach is decompressed. Vascular/Lymphatic: Aortic atherosclerosis. No enlarged abdominal or pelvic lymph nodes. Reproductive: Prostate is unremarkable. Other: Mild free fluid is noted within the pelvis and extending surrounding the spleen consistent with the changes involving the right ureter and kidney. This likely represents extravasated urine. Musculoskeletal: Bilateral hip replacements are seen. Degenerative changes of lumbar spine are noted. IMPRESSION: Dilatation of the right ureter and renal collecting system with evidence of perinephric stranding and Peri-ureteral fluid likely related to obstructive change with caliceal rupture. An obstructing stone is not visualized at this time. No other focal abnormality is noted. Electronically Signed   By: Alcide Clever M.D.   On: 06/09/2018 13:59    Pending Labs Unresulted Labs (From admission, onward)    Start     Ordered   06/09/18 1615  Sodium  Now then every 6 hours,   STAT     06/09/18 1614   06/09/18 1613  Sodium, urine, random  ONCE - STAT,   STAT     06/09/18 1613   06/09/18 1613  Osmolality, urine  Once,   STAT     06/09/18 1613   Signed and Held  HIV antibody (Routine Testing)  Once,   R     Signed and Held   Signed and Held  CBC  (enoxaparin (LOVENOX)    CrCl < 30 ml/min)  Once,   R    Comments:  Baseline for enoxaparin therapy IF NOT ALREADY DRAWN.  Notify MD if PLT < 100 K.    Signed and Held   Signed and Held  Creatinine, serum   (enoxaparin (LOVENOX)    CrCl < 30 ml/min)  Once,   R    Comments:  Baseline for enoxaparin therapy IF NOT ALREADY DRAWN.    Signed and Held   Signed and Held  Creatinine, serum  (enoxaparin (LOVENOX)    CrCl < 30 ml/min)  Weekly,   R    Comments:  while on enoxaparin therapy.    Signed and Held   Signed and  Held  Comprehensive metabolic panel  Tomorrow morning,   R     Signed and Held   Signed and Held  CBC  Tomorrow morning,   R     Signed and Held          Vitals/Pain Today's Vitals   06/09/18 1730 06/09/18 1757 06/09/18 1800 06/09/18 1830  BP: 134/84  (!) 142/98 137/78  Pulse: (!) 108 (!) 112 (!) 120   Resp:  18    Temp:  98.6 F (37 C)    TempSrc:  Oral    SpO2: 95% 96% 96%   Weight:      Height:      PainSc:  0-No pain      Isolation Precautions No active isolations  Medications Medications  tamsulosin (FLOMAX) capsule 0.4 mg (0.4 mg Oral Given 06/09/18 1756)  0.9 %  sodium chloride infusion (0 mLs Intravenous Stopped 06/09/18 1639)  patiromer Lelon Perla(VELTASSA) packet 8.4 g (8.4 g Oral Given 06/09/18 1756)    Mobility ambulatory

## 2018-06-09 NOTE — Progress Notes (Signed)
Family Meeting Note  Advance Directive:yes  Today a meeting took place with the Patient, spouse at bedside    The following clinical team members were present during this meeting:MD  The following were discussed:Patient's diagnosis: Acute abdominal pain, acute urinary retention, hyponatremia, acute kidney injury, hyperkalemia, benign prostatic hypertrophy, history of sarcoidosis, essential hypertension, treatment plan of care discussed in detail with the patient and his wife at bedside.  They both verbalized understanding of the plan.   Patient's progosis: Unable to determine and Goals for treatment: Full Code  wife healthcare power of attorney  Additional follow-up to be provided: Hospitalist, nephrologist and urologist  Time spent during discussion:17 min  Ramonita Lab, MD

## 2018-06-09 NOTE — ED Notes (Signed)
urologgy at bedside to consult patient

## 2018-06-09 NOTE — ED Provider Notes (Signed)
[facility] Emergency Department Provider Note   ____________________________________________    I have reviewed the triage vital signs and the nursing notes.   HISTORY  Chief Complaint Urinary Retention and Constipation     HPI Wesley Barnes is a 68 y.o. male who presents with complaints of urinary retention.  Patient reports he has had difficulty urinating over the last 4 days.  He also reports decreased bowel movements over that time as well.  He describes distention and pain in his lower abdomen, he feels like he has a "bowling ball in there ".  Denies fevers or chills.  This is never happened before.  Denies new medications.  Recently treated for pneumonia.  He reports that symptoms have improved.  Past Medical History:  Diagnosis Date  . Hypertension     Patient Active Problem List   Diagnosis Date Noted  . Hypertension 01/18/2018  . Sarcoidosis 12/16/2016  . 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 Nocturia 12/21/2015  . Osteoarthritis of right hip 07/29/2015  . 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 Hearing loss of both ears 10/02/2014    Past Surgical History:  Procedure Laterality Date  . COLONOSCOPY  2000  . HERNIA REPAIR  2001  . HIP SURGERY Right    2016    Prior to Admission medications   Medication Sig Start Date End Date Taking? Authorizing Provider  albuterol (PROVENTIL) (2.5 MG/3ML) 0.083% nebulizer solution Take 3 mLs (2.5 mg total) by nebulization every 6 (six) hours as needed for wheezing or shortness of breath. 05/31/18  Yes Darci Current, MD  aspirin EC 81 MG tablet Take by mouth. 07/30/15  Yes [provider]  ciprofloxacin (CIPRO) 500 MG tablet Take 500 mg by mouth 2 (two) times daily. 06/06/18 06/16/18 Yes [provider]  ketoconazole (NIZORAL) 2 % cream Apply 1 application topically daily. 06/06/18  Yes  [provider]  lisinopril (PRINIVIL,ZESTRIL) 10 MG tablet Take by mouth. 06/08/16  Yes [provider]  Multiple Vitamin (MULTIVITAMIN) capsule Take by mouth.   Yes [provider]  benzonatate (TESSALON PERLES) 100 MG capsule Take 1 capsule (100 mg total) by mouth 3 (three) times daily as needed for cough. Patient not taking: Reported on 06/09/2018 05/31/18 05/31/19  Darci Current, MD  fluticasone-salmeterol (ADVAIR [facility]) [phone] MCG/ACT inhaler Inhale into the lungs. 09/23/15   [provider]  triamcinolone acetonide (TRIESENCE) 40 MG/ML SUSP Inject into the articular space. 11/19/17   [provider]     Allergies Patient has no known allergies.  No family history on file.  Social History Social History   Tobacco Use  . Smoking status: Former Games developer  . Smokeless tobacco: Never Used  Substance Use Topics  . Alcohol use: Yes  . Drug use: No    Review of Systems  Constitutional: No fever/chills Eyes: No visual changes.  ENT: No sore throat. Cardiovascular: Denies chest pain. Respiratory: Denies shortness of breath. Gastrointestinal: As above Genitourinary: As above Musculoskeletal: Negative for back pain. Skin: Negative for rash. Neurological: Negative for headaches    ____________________________________________   PHYSICAL EXAM:  VITAL SIGNS: ED Triage Vitals  Enc Vitals Group     BP 06/09/18 0944 (!) 148/98     Pulse Rate 06/09/18 0944 95     Resp 06/09/18 0944 16     Temp 06/09/18 0944 97.8 F (36.6 C)     Temp  Source 06/09/18 0944 Oral     SpO2 06/09/18 0944 98 %     Weight 06/09/18 0945 79.4 kg (175 lb)     Height 06/09/18 0945 1.727 m (5\' 8" )     Head Circumference --      Peak Flow --      Pain Score 06/09/18 0945 8     Pain Loc --      Pain Edu? --      Excl. in GC? --     Constitutional: Alert and oriented. No acute distress.  Eyes: Conjunctivae are normal.   Nose: No  congestion/rhinnorhea. Mouth/Throat: Mucous membranes are moist.    Cardiovascular: Normal rate, regular rhythm. Grossly normal heart sounds.  Good peripheral circulation. Respiratory: Normal respiratory effort.  No retractions. Lungs CTAB. Gastrointestinal: Significant distention primarily lower abdomen, mild tenderness  Musculoskeletal:  Warm and well perfused Neurologic:  Normal speech and language. No gross focal neurologic deficits are appreciated.  Skin:  Skin is warm, dry and intact. No rash noted. Psychiatric: Mood and affect are normal. Speech and behavior are normal.  ____________________________________________   LABS (all labs ordered are listed, but only abnormal results are displayed)  Labs Reviewed  URINALYSIS, COMPLETE (UACMP) WITH MICROSCOPIC - Abnormal; Notable for the following components:      Result Value   Color, Urine YELLOW (*)    APPearance CLEAR (*)    All other components within normal limits  BASIC METABOLIC PANEL - Abnormal; Notable for the following components:   Sodium 122 (*)    Potassium 5.4 (*)    Chloride 87 (*)    CO2 19 (*)    Glucose, Bld 118 (*)    BUN 51 (*)    Creatinine, Ser 5.34 (*)    Calcium 8.6 (*)    GFR calc non Af Amer 10 (*)    GFR calc Af Amer 12 (*)    Anion gap 16 (*)    All other components within normal limits  CBC - Abnormal; Notable for the following components:   WBC 17.8 (*)    RBC 4.14 (*)    Hemoglobin 12.9 (*)    HCT 37.6 (*)    Platelets 473 (*)    All other components within normal limits   ____________________________________________  EKG  None ____________________________________________  RADIOLOGY  None ____________________________________________   PROCEDURES  Procedure(s) performed: No  Procedures   Critical Care performed: No ____________________________________________   INITIAL IMPRESSION / ASSESSMENT AND PLAN / ED COURSE  Pertinent labs & imaging results that were available  during my care of the patient were reviewed by me and considered in my medical decision making (see chart for details).  Patient with reports of urinary retention, significant swelling suprapubically consistent with severe urinary retention.  Lab work is concerning for hyponatremia, mild hyperkalemia acute renal failure, likely this is all likely post renal and will improve after catheterization.  Foley catheter inserted by nurse patient had immediate relief   Patient with significant acute kidney injury with BUN and creatinine of 51 and 5.34.  He is also significantly hyponatremic and very mildly hyperkalemic likely also related to urinary retention.  CT negative for masses  IV fluids infusing, discussed with Dr. Amado CoeGouru of hospitalist service for admission    ____________________________________________   FINAL CLINICAL IMPRESSION(S) / ED DIAGNOSES  Final diagnoses:  Acute urinary retention  Acute kidney injury [facility](HCC)        Note:  This document was prepared using Dragon voice recognition  software and may include unintentional dictation errors.   Jene Every, MD 06/09/18 1431

## 2018-06-09 NOTE — Consult Note (Addendum)
5:54 PM   Wesley Barnes 1951-04-09 [id number]  CC: Urinary retention  HPI: I was asked to see Wesley Barnes in consultation for urinary retention from Dr. Margaretmary Eddy.  He is a 68 year old male followed by Dr. Bernardo Heater in urology for mildly elevated PSA of around 4.5 who was admitted to the ER this morning with 4 days of urinary retention.  A catheter was placed with return of clear yellow urine.  His labs were grossly abnormal with an AKI with creatinine of 5.34, EGFR of 10, hyponatremia, hyperkalemia, and leukocytosis to 18K.  Urinalysis was negative for any RBCs or signs of infection.  A CT scan was performed that showed decompressed bladder with catheter in place, with dilation of the right ureter and collecting system with perinephric stranding and fluid suspicious for calyceal rupture.  No ureteral stones were seen.  Prostate measures 90 cc on CT.  Of note, he was also recently hospitalized for pneumonia and treated with antibiotics.  He denies any right or left-sided flank pain.  He denies gross hematuria.  Before he went into acute retention, he denied any urinary symptoms aside from a weak stream at baseline.  He denies any fevers or chills.   PMH: Past Medical History:  Diagnosis Date  . Hypertension     Surgical History: Past Surgical History:  Procedure Laterality Date  . COLONOSCOPY  2000  . HERNIA REPAIR  2001  . HIP SURGERY Right    2016    Allergies: No Known Allergies  Family History: No family history on file.  Social History:  reports that he has quit smoking. He has never used smokeless tobacco. He reports current alcohol use. He reports that he does not use drugs.  ROS: Negative aside from those stated in the HPI  Physical Exam: BP 134/84   Pulse (!) 108   Temp 98.6 F (37 C) (Oral)   Resp 18   Ht _0  (1.727 m)   Wt 79.4 kg   SpO2 95%   BMI 26.61 kg/m    Constitutional:  Alert and oriented, No acute distress. Cardiovascular: No clubbing, cyanosis, or  edema. Respiratory: Normal respiratory effort, no increased work of breathing. GI: Abdomen is soft, nontender, nondistended, no abdominal masses GU: No CVA tenderness, Foley with clear yellow urine DRE: Deferred Lymph: No cervical or inguinal lymphadenopathy. Skin: No rashes, bruises or suspicious lesions. Neurologic: Grossly intact, no focal deficits, moving all 4 extremities. Psychiatric: Normal mood and affect.  Laboratory Data: Reviewed, see HPI  Pertinent Imaging: I have personally reviewed the CT from 06/09/2018.  Prostate measures 90 cc.  Bladder is decompressed around a Foley catheter.  There is mild right hydroureteronephrosis down to the level of the bladder with perinephric stranding and fluid concerning for possible calyceal rupture.  Assessment & Plan:   In summary, the patient is a 68 year old male who developed acute urinary retention with AKI with no urine output over the last 4 days.  Currently has a Foley catheter draining clear yellow urine with excellent output.  Regarding his CT findings, I suspect he had prolonged urinary retention with increased pressure causing reflux with resulting right-sided upstream hydronephrosis and calyceal rupture.  This was explained at length to the patient.  We also reviewed his history of mildly elevated PSA, and the relationship to enlarged prostate.  Though we cannot rule out prostate cancer without a biopsy, his mildly elevated PSA is much more likely to a significantly enlarged gland (90 cc).  We briefly reviewed outlet  procedures and indications.  He is hesitant to undergo any surgery, and would like to start with medical management with Flomax.  -Monitor for post-obstructive diuresis, trend renal function -Maintain Foley, follow-up in urology clinic for voiding trial in 7 to 10 days (ordered) -Start Flomax 0.4 mg nightly -Follow-up with Dr. Bernardo Heater in 4 to 6-weeks to discuss ongoing medical management versus outlet procedures.  He may be  a better candidate for HOLEP then TURP with his very enlarged 90 cc gland.  Would also need to discuss prostate biopsy prior to any surgical intervention with his history of mild PSA elevation. -Consider repeating renal ultrasound versus CT in follow-up to confirm resolution of hydronephrosis and perinephric stranding -Call if questions   [place], [place] [address] [place], [place] [address] [phone]

## 2018-06-09 NOTE — ED Notes (Signed)
Report given to charge nurse. Patient to leave unit to floor via stretcher.

## 2018-06-09 NOTE — Progress Notes (Signed)
PHARMACIST - PHYSICIAN COMMUNICATION  CONCERNING:  Enoxaparin (Lovenox) for DVT Prophylaxis    RECOMMENDATION: Patient was prescribed enoxaprin 30mg  q24 hours for VTE prophylaxis.   Filed Weights   06/09/18 0945  Weight: 175 lb (79.4 kg)    Body mass index is 26.61 kg/m.  Estimated Creatinine Clearance: 13 mL/min (A) (by C-G formula based on SCr of 5.34 mg/dL (H)).   Based on [facility] policy patient is candidate for substitution to heparin subcutaneously 5000 units every 8 hours  DESCRIPTION: Pharmacy has adjusted enoxaparin dose per [facility] policy.  Patient is now receiving heparin subcutaneously 5000 units every 8 hours  Orinda Kenner, PharmD Clinical Pharmacist  06/09/2018 7:37 PM

## 2018-06-09 NOTE — H&P (Signed)
[facility] [place] at [facility]   PATIENT NAME: Wesley Barnes    MR#:  [id number]  DATE OF BIRTH:  22-Dec-1950  DATE OF ADMISSION:  06/09/2018  PRIMARY CARE PHYSICIAN: Jerl MinaHedrick, James, MD   REQUESTING/REFERRING PHYSICIAN: Cyril LoosenKinner  CHIEF COMPLAINT:  Urinary retention and constipation with abdominal pain  HISTORY OF PRESENT ILLNESS:  Wesley AversMarc Barnes  is a 68 y.o. male with a known history of benign prostatic hypertrophy, sarcoidosis, essential hypertension is presenting to the ED with a chief complaint of urinary retention for the past 4 days.  Associated with nausea but denies any vomiting.  Patient also reporting lower abdominal pain.  Patient reports significant abdominal distention and lower abdominal discomfort.  Denies any fever or chills.  Denies any chest pain or shortness of breath.  Has recently treated for pneumonia  PAST MEDICAL HISTORY:   Past Medical History:  Diagnosis Date  . Hypertension    Sarcoidosis, benign prostatic hypertrophy, osteoarthritis of the hip PAST SURGICAL HISTOIRY:   Past Surgical History:  Procedure Laterality Date  . COLONOSCOPY  2000  . HERNIA REPAIR  2001  . HIP SURGERY Right    2016    SOCIAL HISTORY:   Social History   Tobacco Use  . Smoking status: Former Games developermoker  . Smokeless tobacco: Never Used  Substance Use Topics  . Alcohol use: Yes    FAMILY HISTORY:  Hypertension runs in his family  DRUG ALLERGIES:  No Known Allergies  REVIEW OF SYSTEMS:  CONSTITUTIONAL: No fever, fatigue or weakness.  EYES: No blurred or double vision.  EARS, NOSE, AND THROAT: No tinnitus or ear pain.  RESPIRATORY: No cough, shortness of breath, wheezing or hemoptysis.  CARDIOVASCULAR: No chest pain, orthopnea, edema.  GASTROINTESTINAL: Reporting nausea, abdominal distention and lower abdominal pain denies any vomiting or diarrhea.  Reporting constipation GENITOURINARY: Reports urinary retention ENDOCRINE: No polyuria,  nocturia,  HEMATOLOGY: No anemia, easy bruising or bleeding SKIN: No rash or lesion. MUSCULOSKELETAL: No joint pain or arthritis.   NEUROLOGIC: No tingling, numbness, weakness.  PSYCHIATRY: No anxiety or depression.   MEDICATIONS AT HOME:   Prior to Admission medications   Medication Sig Start Date End Date Taking? Authorizing Provider  albuterol (PROVENTIL) (2.5 MG/3ML) 0.083% nebulizer solution Take 3 mLs (2.5 mg total) by nebulization every 6 (six) hours as needed for wheezing or shortness of breath. 05/31/18  Yes Darci CurrentBrown, [place] N, MD  aspirin EC 81 MG tablet Take by mouth. 07/30/15  Yes [provider]  ciprofloxacin (CIPRO) 500 MG tablet Take 500 mg by mouth 2 (two) times daily. 06/06/18 06/16/18 Yes [provider]  ketoconazole (NIZORAL) 2 % cream Apply 1 application topically daily. 06/06/18  Yes [provider]  lisinopril (PRINIVIL,ZESTRIL) 10 MG tablet Take by mouth. 06/08/16  Yes [provider]  Multiple Vitamin (MULTIVITAMIN) capsule Take by mouth.   Yes [provider]  benzonatate (TESSALON PERLES) 100 MG capsule Take 1 capsule (100 mg total) by mouth 3 (three) times daily as needed for cough. Patient not taking: Reported on 06/09/2018 05/31/18 05/31/19  Darci CurrentBrown, [place] N, MD  fluticasone-salmeterol (ADVAIR [facility]) [phone] MCG/ACT inhaler Inhale into the lungs. 09/23/15   [provider]  triamcinolone acetonide (TRIESENCE) 40 MG/ML SUSP Inject into the articular space. 11/19/17   [provider]      VITAL SIGNS:  Blood pressure 140/90, pulse (!) 106, temperature 98.6 F (37 C), temperature source Oral, resp. rate 18, height 5\' 8"  (1.727 m), weight 79.4  kg, SpO2 98 %.  PHYSICAL EXAMINATION:  GENERAL:  68 y.o.-year-old patient lying in the bed with no acute distress.  EYES: Pupils equal, round, reactive to light and accommodation. No scleral icterus. Extraocular muscles intact.  HEENT: Head atraumatic, normocephalic.  Oropharynx and nasopharynx clear.  NECK:  Supple, no jugular venous distention. No thyroid enlargement, no tenderness.  LUNGS: Normal breath sounds bilaterally, no wheezing, rales,rhonchi or crepitation. No use of accessory muscles of respiration.  CARDIOVASCULAR: S1, S2 normal. No murmurs, rubs, or gallops.  ABDOMEN: Soft, nontender, minimally distended. Bowel sounds present.Marland Kitchen  EXTREMITIES: No pedal edema, cyanosis, or clubbing.  NEUROLOGIC: Cranial nerves II through XII are intact. Muscle strength 5/5 in all extremities. Sensation intact. Gait not checked.  PSYCHIATRIC: The patient is alert and oriented x 3.  SKIN: No obvious rash, lesion, or ulcer.   LABORATORY PANEL:   CBC Recent Labs  Lab 06/09/18 0948  WBC 17.8*  HGB 12.9*  HCT 37.6*  PLT 473*   ------------------------------------------------------------------------------------------------------------------  Chemistries  Recent Labs  Lab 06/09/18 0948  NA 122*  K 5.4*  CL 87*  CO2 19*  GLUCOSE 118*  BUN 51*  CREATININE 5.34*  CALCIUM 8.6*   ------------------------------------------------------------------------------------------------------------------  Cardiac Enzymes No results for input(s): TROPONINI in the last 168 hours. ------------------------------------------------------------------------------------------------------------------  RADIOLOGY:  Ct Abdomen Pelvis Wo Contrast  Result Date: 06/09/2018 CLINICAL DATA:  Difficulty voiding EXAM: CT ABDOMEN AND PELVIS WITHOUT CONTRAST TECHNIQUE: Multidetector CT imaging of the abdomen and pelvis was performed following the standard protocol without IV contrast. COMPARISON:  11/19/2013 FINDINGS: Lower chest: No acute abnormality. Hepatobiliary: No focal liver abnormality is seen. No gallstones, gallbladder wall thickening, or biliary dilatation. Pancreas: Unremarkable. No pancreatic ductal dilatation or surrounding inflammatory changes. Spleen: Spleen is within  normal limits. Mild perisplenic fluid is noted. Adrenals/Urinary Tract: Adrenal glands are within normal limits. Left kidney demonstrates no renal calculi or obstructive changes. The ureter appears within normal limits. The right kidney demonstrates some perinephric changes as well as fullness of the collecting system. Fluid is noted tracking along the course of the right ureter and kidney towards the bladder. The right ureter is distended to the level of the urinary bladder. The bladder is decompressed by Foley catheter. Stomach/Bowel: No obstructive or inflammatory changes of the bowel are noted. The appendix is within normal limits. The stomach is decompressed. Vascular/Lymphatic: Aortic atherosclerosis. No enlarged abdominal or pelvic lymph nodes. Reproductive: Prostate is unremarkable. Other: Mild free fluid is noted within the pelvis and extending surrounding the spleen consistent with the changes involving the right ureter and kidney. This likely represents extravasated urine. Musculoskeletal: Bilateral hip replacements are seen. Degenerative changes of lumbar spine are noted. IMPRESSION: Dilatation of the right ureter and renal collecting system with evidence of perinephric stranding and Peri-ureteral fluid likely related to obstructive change with caliceal rupture. An obstructing stone is not visualized at this time. No other focal abnormality is noted. Electronically Signed   By: Alcide Clever M.D.   On: 06/09/2018 13:59    EKG:   Orders placed or performed during the hospital encounter of 05/31/18  . ED EKG within 10 minutes  . ED EKG within 10 minutes  . EKG    IMPRESSION AND PLAN:    #Acute kidney injury from acute urinary retention-post renal Admit to MedSurg unit Foley catheter placed and patient had 3 L of urine output so far Monitor renal function closely Urology consult placed and discussed with Dr. Richardo Hanks who is recommending to continue Foley catheter  for at least 10 days Flomax  started Baseline creatinine normal today it is at 5.3 CT abdomen has revealed a dilatation of the right-sided ureter and renal collecting system with perinephric stranding related to obstructive changes with a caliceal rupture  #Acute hyponatremia from decreased p.o. intake and intractable nausea IV fluids Full liquid diet Check random urine sodium and urine osmolality Serial sodiums. Patient is mentating fine Nephrology consult placed and haiku chart sent to Dr. Lourdes Sledge, not seen by Dr. Lourdes Sledge, rounding physician to follow-up  #Constipation from acute urinary retention Constipation should automatically be resolved once the underlying problem is addressed We will add stool softeners as needed  #Hyperkalemia in view of acute kidney injury [place] Discontinue home medication lisinopril  #Essential hypertension Hold lisinopril in view of hyperkalemia Blood pressure is stable.  Continue close monitoring  #Sarcoidosis Stable as reported by the patient.  Outpatient follow-up with pulmonology for surveillance   DVT prophylaxis-Lovenox  All the records are reviewed and case discussed with ED provider. Management plans discussed with the patient, family and they are in agreement.  CODE STATUS: fc   TOTAL TIME TAKING CARE OF THIS PATIENT: 43  minutes.   Note: This dictation was prepared with Dragon dictation along with smaller phrase technology. Any transcriptional errors that result from this process are unintentional.  Ramonita Lab M.D on 06/09/2018 at 4:16 PM  Between 7am to 6pm - Pager - [phone]  After 6pm go to [email or web address] - password [email or web address] [facility]  [place] [facility]  Office  [phone]  CC: Primary care physician; Jerl Mina, MD

## 2018-06-09 NOTE — ED Notes (Signed)
Patient denies pain, awaiting bed status and urology consult. Foley to gravity. 3500 clear yellow urine output recorded. Vss. Call light within reach will continue to monitor.

## 2018-06-09 NOTE — ED Triage Notes (Signed)
Pt was recently treated for pna and his URI symptoms resolved but pt then developed prostatitis for which he was placen on an antibiotic on Monday.  Pt reports that he has still not been able to void other than some dribbles since Monday and has also been constipated and has not been able to have a BM since Monday.  Pt reports that his abdomen feels "like a basketball".  No fever, no chills, no vomiting.  Pt has been nauseated.

## 2018-06-10 LAB — CBC
HCT: 35.8 % — ABNORMAL LOW (ref 39.0–52.0)
Hemoglobin: 11.7 g/dL — ABNORMAL LOW (ref 13.0–17.0)
MCH: 30.7 pg (ref 26.0–34.0)
MCHC: 32.7 g/dL (ref 30.0–36.0)
MCV: 94 fL (ref 80.0–100.0)
Platelets: 404 10*3/uL — ABNORMAL HIGH (ref 150–400)
RBC: 3.81 MIL/uL — ABNORMAL LOW (ref 4.22–5.81)
RDW: 13.4 % (ref 11.5–15.5)
WBC: 7.9 10*3/uL (ref 4.0–10.5)
nRBC: 0 % (ref 0.0–0.2)

## 2018-06-10 LAB — COMPREHENSIVE METABOLIC PANEL
ALT: 18 U/L (ref 0–44)
AST: 20 U/L (ref 15–41)
Albumin: 3 g/dL — ABNORMAL LOW (ref 3.5–5.0)
Alkaline Phosphatase: 57 U/L (ref 38–126)
Anion gap: 5 (ref 5–15)
BUN: 28 mg/dL — ABNORMAL HIGH (ref 8–23)
CHLORIDE: 107 mmol/L (ref 98–111)
CO2: 27 mmol/L (ref 22–32)
Calcium: 9.1 mg/dL (ref 8.9–10.3)
Creatinine, Ser: 1.76 mg/dL — ABNORMAL HIGH (ref 0.61–1.24)
GFR calc Af Amer: 45 mL/min — ABNORMAL LOW (ref >60)
GFR calc non Af Amer: 39 mL/min — ABNORMAL LOW (ref >60)
Glucose, Bld: 112 mg/dL — ABNORMAL HIGH (ref 70–99)
POTASSIUM: 4.9 mmol/L (ref 3.5–5.1)
Sodium: 139 mmol/L (ref 135–145)
Total Bilirubin: 1 mg/dL (ref 0.3–1.2)
Total Protein: 6.3 g/dL — ABNORMAL LOW (ref 6.5–8.1)

## 2018-06-10 LAB — MAGNESIUM: Magnesium: 2.2 mg/dL (ref 1.7–2.4)

## 2018-06-10 LAB — SODIUM, URINE, RANDOM: Sodium, Ur: 88 mmol/L

## 2018-06-10 LAB — OSMOLALITY, URINE: Osmolality, Ur: 322 mOsm/kg (ref 300–900)

## 2018-06-10 MED ORDER — ENOXAPARIN SODIUM 40 MG/0.4ML ~~LOC~~ SOLN
40.0000 mg | SUBCUTANEOUS | Status: DC
  Administered 2018-06-10: 40 mg via SUBCUTANEOUS
  Filled 2018-06-10: qty 0.4

## 2018-06-10 MED ORDER — DEXTROSE 5 % IV SOLN
INTRAVENOUS | Status: DC
  Administered 2018-06-10 – 2018-06-11 (×3): via INTRAVENOUS

## 2018-06-10 NOTE — Progress Notes (Signed)
[facility] - [place] at [facility]                                                                                                                                                                                  Patient Demographics   Wesley Barnes, is a 68 y.o. male, DOB - Jan 28, 1951, MRA:[id number]  Admit date - 06/09/2018   Admitting Physician Ramonita Lab, MD  Outpatient Primary MD for the patient is Jerl Mina, MD   LOS - 1  Subjective: Patient is denying any complaints.  Renal function is improved.    Review of Systems:   CONSTITUTIONAL: No documented fever. No fatigue, weakness. No weight gain, no weight loss.  EYES: No blurry or double vision.  ENT: No tinnitus. No postnasal drip. No redness of the oropharynx.  RESPIRATORY: No cough, no wheeze, no hemoptysis. No dyspnea.  CARDIOVASCULAR: No chest pain. No orthopnea. No palpitations. No syncope.  GASTROINTESTINAL: No nausea, no vomiting or diarrhea. No abdominal pain. No melena or hematochezia.  GENITOURINARY: No dysuria or hematuria.  ENDOCRINE: No polyuria or nocturia. No heat or cold intolerance.  HEMATOLOGY: No anemia. No bruising. No bleeding.  INTEGUMENTARY: No rashes. No lesions.  MUSCULOSKELETAL: No arthritis. No swelling. No gout.  NEUROLOGIC: No numbness, tingling, or ataxia. No seizure-type activity.  PSYCHIATRIC: No anxiety. No insomnia. No ADD.    Vitals:   Vitals:   06/09/18 1830 06/09/18 1911 06/10/18 0617 06/10/18 1218  BP: 137/78 140/74 (!) 143/80 130/79  Pulse:  (!) 116 100 (!) 110  Resp:  17 20 20   Temp:  97.9 F (36.6 C) (!) 97.4 F (36.3 C) 98.3 F (36.8 C)  TempSrc:  Oral Oral Oral  SpO2:  96% 97% 97%  Weight:      Height:        Wt Readings from Last 3 Encounters:  06/09/18 79.4 kg  05/31/18 79.4 kg  01/18/18 79 kg     Intake/Output Summary (Last 24 hours) at 06/10/2018 1349 Last data filed at 06/10/2018 0941 Gross per 24 hour  Intake 2602.93 ml  Output 9250 ml   Net -6647.07 ml    Physical Exam:   GENERAL: Pleasant-appearing in no apparent distress.  HEAD, EYES, EARS, NOSE AND THROAT: Atraumatic, normocephalic. Extraocular muscles are intact. Pupils equal and reactive to light. Sclerae anicteric. No conjunctival injection. No oro-pharyngeal erythema.  NECK: Supple. There is no jugular venous distention. No bruits, no lymphadenopathy, no thyromegaly.  HEART: Regular rate and rhythm,. No murmurs, no rubs, no clicks.  LUNGS: Clear to auscultation bilaterally. No rales or rhonchi. No wheezes.  ABDOMEN: Soft, flat, nontender, nondistended. Has good bowel sounds. No hepatosplenomegaly appreciated.  EXTREMITIES: No evidence of any cyanosis, clubbing, or peripheral edema.  +2 pedal and radial pulses bilaterally.  NEUROLOGIC: The patient is alert, awake, and oriented x3 with no focal motor or sensory deficits appreciated bilaterally.  SKIN: Moist and warm with no rashes appreciated.  Psych: Not anxious, depressed LN: No inguinal LN enlargement    Antibiotics   Anti-infectives (From admission, onward)   None      Medications   Scheduled Meds: . aspirin EC  81 mg Oral Daily  . enoxaparin (LOVENOX) injection  40 mg Subcutaneous Q24H  . mometasone-formoterol  2 puff Inhalation BID  . multivitamin with minerals  1 tablet Oral Daily  . polyethylene glycol  17 g Oral Daily  . tamsulosin  0.4 mg Oral Daily   Continuous Infusions: . dextrose 100 mL/hr at 06/10/18 1138   PRN Meds:.acetaminophen **OR** acetaminophen, albuterol, ondansetron **OR** ondansetron (ZOFRAN) IV, traMADol   Data Review:   Micro Results No results found for this or any previous visit (from the past 240 hour(s)).  Radiology Reports Ct Abdomen Pelvis Wo Contrast  Result Date: 06/09/2018 CLINICAL DATA:  Difficulty voiding EXAM: CT ABDOMEN AND PELVIS WITHOUT CONTRAST TECHNIQUE: Multidetector CT imaging of the abdomen and pelvis was performed following the standard protocol  without IV contrast. COMPARISON:  11/19/2013 FINDINGS: Lower chest: No acute abnormality. Hepatobiliary: No focal liver abnormality is seen. No gallstones, gallbladder wall thickening, or biliary dilatation. Pancreas: Unremarkable. No pancreatic ductal dilatation or surrounding inflammatory changes. Spleen: Spleen is within normal limits. Mild perisplenic fluid is noted. Adrenals/Urinary Tract: Adrenal glands are within normal limits. Left kidney demonstrates no renal calculi or obstructive changes. The ureter appears within normal limits. The right kidney demonstrates some perinephric changes as well as fullness of the collecting system. Fluid is noted tracking along the course of the right ureter and kidney towards the bladder. The right ureter is distended to the level of the urinary bladder. The bladder is decompressed by Foley catheter. Stomach/Bowel: No obstructive or inflammatory changes of the bowel are noted. The appendix is within normal limits. The stomach is decompressed. Vascular/Lymphatic: Aortic atherosclerosis. No enlarged abdominal or pelvic lymph nodes. Reproductive: Prostate is unremarkable. Other: Mild free fluid is noted within the pelvis and extending surrounding the spleen consistent with the changes involving the right ureter and kidney. This likely represents extravasated urine. Musculoskeletal: Bilateral hip replacements are seen. Degenerative changes of lumbar spine are noted. IMPRESSION: Dilatation of the right ureter and renal collecting system with evidence of perinephric stranding and Peri-ureteral fluid likely related to obstructive change with caliceal rupture. An obstructing stone is not visualized at this time. No other focal abnormality is noted. Electronically Signed   By: Alcide CleverMark  Lukens M.D.   On: 06/09/2018 13:59   Dg Chest 2 View  Result Date: 05/31/2018 CLINICAL DATA:  Shortness of breath, chest pain EXAM: CHEST - 2 VIEW COMPARISON:  08/02/2016 FINDINGS: There is right upper  lobe airspace disease most concerning for pneumonia. The lungs are hyperinflated likely secondary to COPD. There is no pleural effusion or pneumothorax. The heart and mediastinal contours are unremarkable. The osseous structures are unremarkable. IMPRESSION: Right upper lobe pneumonia. Followup PA and lateral chest X-ray is recommended in 3-4 weeks following trial of antibiotic therapy to ensure resolution and exclude underlying malignancy. Electronically Signed   By: Elige KoHetal  Iann Rodier   On: 05/31/2018 12:44     CBC Recent Labs  Lab 06/09/18 0948 06/10/18 0352  WBC 17.8* 7.9  HGB 12.9* 11.7*  HCT  37.6* 35.8*  PLT 473* 404*  MCV 90.8 94.0  MCH 31.2 30.7  MCHC 34.3 32.7  RDW 13.3 13.4    Chemistries  Recent Labs  Lab 06/09/18 0948 06/09/18 1915 06/09/18 2208 06/10/18 0352  NA 122* 134* 136 139  K 5.4*  --   --  4.9  CL 87*  --   --  107  CO2 19*  --   --  27  GLUCOSE 118*  --   --  112*  BUN 51*  --   --  28*  CREATININE 5.34*  --   --  1.76*  CALCIUM 8.6*  --   --  9.1  MG  --   --   --  2.2  AST  --   --   --  20  ALT  --   --   --  18  ALKPHOS  --   --   --  57  BILITOT  --   --   --  1.0   ------------------------------------------------------------------------------------------------------------------ estimated creatinine clearance is 39.4 mL/min (A) (by C-G formula based on SCr of 1.76 mg/dL (H)). ------------------------------------------------------------------------------------------------------------------ No results for input(s): HGBA1C in the last 72 hours. ------------------------------------------------------------------------------------------------------------------ No results for input(s): CHOL, HDL, LDLCALC, TRIG, CHOLHDL, LDLDIRECT in the last 72 hours. ------------------------------------------------------------------------------------------------------------------ No results for input(s): TSH, T4TOTAL, T3FREE, THYROIDAB in the last 72 hours.  Invalid  input(s): FREET3 ------------------------------------------------------------------------------------------------------------------ No results for input(s): VITAMINB12, FOLATE, FERRITIN, TIBC, IRON, RETICCTPCT in the last 72 hours.  Coagulation profile No results for input(s): INR, PROTIME in the last 168 hours.  No results for input(s): DDIMER in the last 72 hours.  Cardiac Enzymes No results for input(s): CKMB, TROPONINI, MYOGLOBIN in the last 168 hours.  Invalid input(s): CK ------------------------------------------------------------------------------------------------------------------ Invalid input(s): POCBNP    Assessment & Plan   #Acute kidney injury from acute urinary retention-post renal Continue Foley Continue Flomax Appreciate urology and nephrology input   #Acute hyponatremia from decreased p.o. intake and intractable nausea Solved  #Constipation from acute urinary retention Add MiraLAX to current regimen  #Hyperkalemia in view of acute kidney injury Now resolved  #Essential hypertension Hold lisinopril in view of hyperkalemia Blood pressure is stable.  Continue close monitoring  #Sarcoidosis Stable as reported by the patient.  Outpatient follow-up with pulmonology for surveillance     Code Status Orders  (From admission, onward)         Start     Ordered   06/09/18 1929  Full code  Continuous     06/09/18 1929        Code Status History    This patient has a current code status but no historical code status.           Consults neurology, nephrology  DVT Prophylaxis Lovenox  Lab Results  Component Value Date   PLT 404 (H) 06/10/2018     Time Spent in minutes   35 minutes spent  Greater than 50% of time spent in care coordination and counseling patient regarding the condition and plan of care.   Auburn Bilberry M.D on 06/10/2018 at 1:49 PM  Between 7am to 6pm - Pager - [phone]  After 6pm go to [email or web address] -  Social research officer, government  [facility]   Office  [phone]

## 2018-06-10 NOTE — Consult Note (Signed)
[facility] CONSULT NOTE    Date: 06/10/2018                  Patient Name:  Wesley Barnes  MRN: [id number]  DOB: 1951/02/20  Age / Sex: 68 y.o., male         PCP: Jerl MinaHedrick, James, MD                 Service Requesting Consult: Hospitalist                 Reason for Consult: Acute renal failure due to urinary retention            History of Present Illness: Patient is a 68 y.o. male with a PMHx of chronic BPH, sarcoidosis, hypertension, osteoarthritis, who was admitted to [facility] on 06/09/2018 for evaluation of inability to urinate.  Patient apparently has been having urinary retention symptoms for 3 to 4 days prior to admission.  Prior to this however he reports diminished stream and frequent nocturia.  He has known underlying BPH.  CT scan of the abdomen and pelvis was performed which showed dilatation of the right ureter and renal collecting system with evidence of perinephric stranding and periureteral fluid likely related to obstructive change with calyceal rupture.  Foley catheter was placed.  This is resulted in significant improvement in renal function.  Creatinine was 5.34 upon admission and now down to 1.76.  Initial serum sodium was also low at 122 but now 139 with relief of obstruction.   Medications: Outpatient medications: Medications Prior to Admission  Medication Sig Dispense Refill Last Dose  . albuterol (PROVENTIL) (2.5 MG/3ML) 0.083% nebulizer solution Take 3 mLs (2.5 mg total) by nebulization every 6 (six) hours as needed for wheezing or shortness of breath. 75 mL 12 06/08/2018 at 1600  . aspirin EC 81 MG tablet Take by mouth.   06/09/2018 at 0700  . ciprofloxacin (CIPRO) 500 MG tablet Take 500 mg by mouth 2 (two) times daily.   06/08/2018 at 0800  . ketoconazole (NIZORAL) 2 % cream Apply 1 application topically daily.   06/08/2018 at Unknown time  . lisinopril (PRINIVIL,ZESTRIL) 10 MG tablet Take by mouth.   06/09/2018 at 0700  . Multiple Vitamin  (MULTIVITAMIN) capsule Take by mouth.   06/08/2018 at 0700  . benzonatate (TESSALON PERLES) 100 MG capsule Take 1 capsule (100 mg total) by mouth 3 (three) times daily as needed for cough. (Patient not taking: Reported on 06/09/2018) 30 capsule 0 Completed Course at Unknown time  . fluticasone-salmeterol (ADVAIR HFA) 115-21 MCG/ACT inhaler Inhale into the lungs.   Taking  . triamcinolone acetonide (TRIESENCE) 40 MG/ML SUSP Inject into the articular space.   Taking    Current medications: Current Facility-Administered Medications  Medication Dose Route Frequency Provider Last Rate Last Dose  . acetaminophen (TYLENOL) tablet 650 mg  650 mg Oral Q6H PRN Gouru, Aruna, MD       Or  . acetaminophen (TYLENOL) suppository 650 mg  650 mg Rectal Q6H PRN Gouru, Aruna, MD      . albuterol (PROVENTIL) (2.5 MG/3ML) 0.083% nebulizer solution 2.5 mg  2.5 mg Nebulization Q6H PRN Gouru, Aruna, MD      . aspirin EC tablet 81 mg  81 mg Oral Daily Gouru, Aruna, MD   81 mg at 06/10/18 0933  . enoxaparin (LOVENOX) injection 40 mg  40 mg Subcutaneous Q24H Auburn BilberryPatel, Shreyang, MD      . mometasone-formoterol [facility](DULERA) 200-5 MCG/ACT inhaler 2 puff  2 puff Inhalation BID Gouru, Aruna, MD      . multivitamin with minerals tablet 1 tablet  1 tablet Oral Daily Little Ishikawa, RPH   1 tablet at 06/10/18 0933  . ondansetron (ZOFRAN) tablet 4 mg  4 mg Oral Q6H PRN Gouru, Aruna, MD       Or  . ondansetron (ZOFRAN) injection 4 mg  4 mg Intravenous Q6H PRN Gouru, Aruna, MD      . polyethylene glycol (MIRALAX / GLYCOLAX) packet 17 g  17 g Oral Daily Altamese Dilling, MD   17 g at 06/09/18 2319  . tamsulosin (FLOMAX) capsule 0.4 mg  0.4 mg Oral Daily Gouru, Aruna, MD   0.4 mg at 06/10/18 0933  . traMADol (ULTRAM) tablet 50 mg  50 mg Oral Q6H PRN Gouru, Aruna, MD          Allergies: No Known Allergies    Past Medical History: Past Medical History:  Diagnosis Date  . Hypertension      Past Surgical  History: Past Surgical History:  Procedure Laterality Date  . COLONOSCOPY  2000  . HERNIA REPAIR  2001  . HIP SURGERY Right    2016     Family History: No family history on file.   Social History: Social History   Socioeconomic History  . Marital status: Married    Spouse name: Not on file  . Number of children: Not on file  . Years of education: Not on file  . Highest education level: Not on file  Occupational History  . Not on file  Social Needs  . Financial resource strain: Not on file  . Food insecurity:    Worry: Not on file    Inability: Not on file  . Transportation needs:    Medical: Not on file    Non-medical: Not on file  Tobacco Use  . Smoking status: Former Games developer  . Smokeless tobacco: Never Used  Substance and Sexual Activity  . Alcohol use: Yes  . Drug use: No  . Sexual activity: Yes  Lifestyle  . Physical activity:    Days per week: Not on file    Minutes per session: Not on file  . Stress: Not on file  Relationships  . Social connections:    Talks on phone: Not on file    Gets together: Not on file    Attends religious service: Not on file    Active member of club or organization: Not on file    Attends meetings of clubs or organizations: Not on file    Relationship status: Not on file  . Intimate partner violence:    Fear of current or ex partner: Not on file    Emotionally abused: Not on file    Physically abused: Not on file    Forced sexual activity: Not on file  Other Topics Concern  . Not on file  Social History Narrative  . Not on file     Review of Systems: Review of Systems  Constitutional: Negative for chills, fever and malaise/fatigue.  HENT: Negative for congestion, hearing loss and tinnitus.   Eyes: Negative for blurred vision and double vision.  Respiratory: Negative for cough.   Cardiovascular: Negative for chest pain and palpitations.  Gastrointestinal: Positive for nausea. Negative for abdominal pain and  heartburn.  Genitourinary: Positive for dysuria. Negative for flank pain and hematuria.  Musculoskeletal: Negative for falls and myalgias.  Skin: Negative for itching and rash.  Neurological: Negative for dizziness  and weakness.  Endo/Heme/Allergies: Negative for polydipsia. Does not bruise/bleed easily.  Psychiatric/Behavioral: Negative for depression. The patient is not nervous/anxious.     Vital Signs: Blood pressure (!) 143/80, pulse 100, temperature (!) 97.4 F (36.3 C), temperature source Oral, resp. rate 20, height 5\' 8"  (1.727 m), weight 79.4 kg, SpO2 97 %.  Weight trends: Filed Weights   06/09/18 0945  Weight: 79.4 kg    Physical Exam: General: NAD, sitting up in bed  Head: Normocephalic, atraumatic.  Eyes: Anicteric, EOMI  Nose: Mucous membranes moist, not inflammed, nonerythematous.  Throat: Oropharynx nonerythematous, no exudate appreciated.   Neck: Supple, trachea midline.  Lungs:  Normal respiratory effort. Clear to auscultation BL without crackles or wheezes.  Heart: RRR. S1 and S2 normal without gallop, murmur, or rubs.  Abdomen:  BS normoactive. Soft, Nondistended, non-tender.  No masses or organomegaly.  Extremities: No pretibial edema.  Neurologic: A&O X3, Motor strength is 5/5 in the all 4 extremities  Skin: No visible rashes, scars.    Lab results: Basic Metabolic Panel: Recent Labs  Lab 06/09/18 0948 06/09/18 1915 06/09/18 2208 06/10/18 0352  NA 122* 134* 136 139  K 5.4*  --   --  4.9  CL 87*  --   --  107  CO2 19*  --   --  27  GLUCOSE 118*  --   --  112*  BUN 51*  --   --  28*  CREATININE 5.34*  --   --  1.76*  CALCIUM 8.6*  --   --  9.1  MG  --   --   --  2.2    Liver Function Tests: Recent Labs  Lab 06/10/18 0352  AST 20  ALT 18  ALKPHOS 57  BILITOT 1.0  PROT 6.3*  ALBUMIN 3.0*   No results for input(s): LIPASE, AMYLASE in the last 168 hours. No results for input(s): AMMONIA in the last 168 hours.  CBC: Recent Labs  Lab  06/09/18 0948 06/10/18 0352  WBC 17.8* 7.9  HGB 12.9* 11.7*  HCT 37.6* 35.8*  MCV 90.8 94.0  PLT 473* 404*    Cardiac Enzymes: No results for input(s): CKTOTAL, CKMB, CKMBINDEX, TROPONINI in the last 168 hours.  BNP: Invalid input(s): POCBNP  CBG: No results for input(s): GLUCAP in the last 168 hours.  Microbiology: Results for orders placed or performed in visit on 09/09/17  Microscopic Examination     Status: Abnormal   Collection Time: 09/09/17  9:46 AM  Result Value Ref Range Status   WBC, UA None seen 0 - 5 /hpf Final   RBC, UA None seen 0 - 2 /hpf Final   Epithelial Cells (non renal) None seen 0 - 10 /hpf Final   Mucus, UA Present (A) Not Estab. Final   Bacteria, UA Few (A) None seen/Few Final    Coagulation Studies: No results for input(s): LABPROT, INR in the last 72 hours.  Urinalysis: Recent Labs    06/09/18 1246  COLORURINE YELLOW*  LABSPEC 1.011  PHURINE 7.0  GLUCOSEU NEGATIVE  HGBUR NEGATIVE  BILIRUBINUR NEGATIVE  KETONESUR NEGATIVE  PROTEINUR NEGATIVE  NITRITE NEGATIVE  LEUKOCYTESUR NEGATIVE      Imaging: Ct Abdomen Pelvis Wo Contrast  Result Date: 06/09/2018 CLINICAL DATA:  Difficulty voiding EXAM: CT ABDOMEN AND PELVIS WITHOUT CONTRAST TECHNIQUE: Multidetector CT imaging of the abdomen and pelvis was performed following the standard protocol without IV contrast. COMPARISON:  11/19/2013 FINDINGS: Lower chest: No acute abnormality. Hepatobiliary: No focal liver  abnormality is seen. No gallstones, gallbladder wall thickening, or biliary dilatation. Pancreas: Unremarkable. No pancreatic ductal dilatation or surrounding inflammatory changes. Spleen: Spleen is within normal limits. Mild perisplenic fluid is noted. Adrenals/Urinary Tract: Adrenal glands are within normal limits. Left kidney demonstrates no renal calculi or obstructive changes. The ureter appears within normal limits. The right kidney demonstrates some perinephric changes as well as  fullness of the collecting system. Fluid is noted tracking along the course of the right ureter and kidney towards the bladder. The right ureter is distended to the level of the urinary bladder. The bladder is decompressed by Foley catheter. Stomach/Bowel: No obstructive or inflammatory changes of the bowel are noted. The appendix is within normal limits. The stomach is decompressed. Vascular/Lymphatic: Aortic atherosclerosis. No enlarged abdominal or pelvic lymph nodes. Reproductive: Prostate is unremarkable. Other: Mild free fluid is noted within the pelvis and extending surrounding the spleen consistent with the changes involving the right ureter and kidney. This likely represents extravasated urine. Musculoskeletal: Bilateral hip replacements are seen. Degenerative changes of lumbar spine are noted. IMPRESSION: Dilatation of the right ureter and renal collecting system with evidence of perinephric stranding and Peri-ureteral fluid likely related to obstructive change with caliceal rupture. An obstructing stone is not visualized at this time. No other focal abnormality is noted. Electronically Signed   By: Alcide Clever M.D.   On: 06/09/2018 13:59      Assessment & Plan: Pt is a 68 y.o. male with a PMHx of chronic BPH, sarcoidosis, hypertension, osteoarthritis, who was admitted to [facility] on 06/09/2018 for evaluation of inability to urinate.  1.  Acute renal failure due to urinary retention. 2.  Urinary retention likely due to BPH. 3.  Calyceal rupture, urology following. 4.  Hypertension.  Plan:  We are asked to see the patient for evaluation management of acute renal failure.  It appears that the patient has had prolonged BPH however he developed acutely obstructive symptoms over the past 3 to 4 days prior to admission.  His creatinine was 5.34 upon admission but now down to 1.76 with relief of obstruction.  Serum sodium was 122 upon admission but now up to 139.  However suspect that that the initial  drop in sodium was likely acute.  We will DC normal saline in favor of D5W at 100 cc/h for the next 24 hours.  Urology has been consulted.  Recommend continued monitoring of renal parameters over the course of hospitalization.  No indication for dialysis at the moment.

## 2018-06-11 LAB — BASIC METABOLIC PANEL
Anion gap: 6 (ref 5–15)
BUN: 16 mg/dL (ref 8–23)
CO2: 28 mmol/L (ref 22–32)
Calcium: 8.7 mg/dL — ABNORMAL LOW (ref 8.9–10.3)
Chloride: 99 mmol/L (ref 98–111)
Creatinine, Ser: 1.09 mg/dL (ref 0.61–1.24)
GFR calc Af Amer: >60 mL/min (ref >60)
GFR calc non Af Amer: >60 mL/min (ref >60)
Glucose, Bld: 111 mg/dL — ABNORMAL HIGH (ref 70–99)
Potassium: 4.7 mmol/L (ref 3.5–5.1)
SODIUM: 133 mmol/L — AB (ref 135–145)

## 2018-06-11 LAB — HIV ANTIBODY (ROUTINE TESTING W REFLEX): HIV Screen 4th Generation wRfx: NONREACTIVE

## 2018-06-11 MED ORDER — TAMSULOSIN HCL 0.4 MG PO CAPS: 0.4000 mg | ORAL_CAPSULE | Freq: Every day | ORAL | 0 refills | Status: DC

## 2018-06-11 MED ORDER — ALUM & MAG HYDROXIDE-SIMETH 200-200-20 MG/5ML PO SUSP
30.0000 mL | Freq: Once | ORAL | Status: AC
  Administered 2018-06-11: 30 mL via ORAL
  Filled 2018-06-11: qty 30

## 2018-06-11 MED ORDER — ALUM & MAG HYDROXIDE-SIMETH 200-200-20 MG/5ML PO SUSP: 30.0000 mL | Freq: Once | ORAL | 0 refills | Status: AC

## 2018-06-11 NOTE — Discharge Summary (Signed)
[facility] -  at [facility]  Wesley Barnes, 68 y.o., DOB 1951/03/04, MRN [id number]. Admission date: 06/09/2018 Discharge Date 06/11/2018 Primary MD Jerl MinaHedrick, James, MD Admitting Physician Ramonita LabAruna Gouru, MD  Admission Diagnosis  Acute kidney injury [facility](HCC) [N17.9] Acute urinary retention [R33.8]  Discharge Diagnosis   Active Problems: Acute kidney injury from acute urinary retention due to BPH Acute hyponatremia due to decreased p.o. intake Constipation from urinary retention Hypokalemia due to #1 Essential hypertension Sarcoidosis  Hospital Course  Wesley Barnes  is a 68 y.o. male with a known history of benign prostatic hypertrophy, sarcoidosis, essential hypertension is presenting to the ED with a chief complaint of urinary retention for the past 4 days.  Patient had a CT scan which also showed some right-sided hydronephrosis.  He was seen by urology who felt that this was related to urinary retention.  Patient had a Foley placed his renal function now is normalized.  He will need to keep the Foley in until seen by urology he started on Flomax.            Consults  urology,nephrology  Significant Tests:  See full reports for all details     Ct Abdomen Pelvis Wo Contrast  Result Date: 06/09/2018 CLINICAL DATA:  Difficulty voiding EXAM: CT ABDOMEN AND PELVIS WITHOUT CONTRAST TECHNIQUE: Multidetector CT imaging of the abdomen and pelvis was performed following the standard protocol without IV contrast. COMPARISON:  11/19/2013 FINDINGS: Lower chest: No acute abnormality. Hepatobiliary: No focal liver abnormality is seen. No gallstones, gallbladder wall thickening, or biliary dilatation. Pancreas: Unremarkable. No pancreatic ductal dilatation or surrounding inflammatory changes. Spleen: Spleen is within normal limits. Mild perisplenic fluid is noted. Adrenals/Urinary Tract: Adrenal glands are within normal limits. Left kidney demonstrates no renal calculi or  obstructive changes. The ureter appears within normal limits. The right kidney demonstrates some perinephric changes as well as fullness of the collecting system. Fluid is noted tracking along the course of the right ureter and kidney towards the bladder. The right ureter is distended to the level of the urinary bladder. The bladder is decompressed by Foley catheter. Stomach/Bowel: No obstructive or inflammatory changes of the bowel are noted. The appendix is within normal limits. The stomach is decompressed. Vascular/Lymphatic: Aortic atherosclerosis. No enlarged abdominal or pelvic lymph nodes. Reproductive: Prostate is unremarkable. Other: Mild free fluid is noted within the pelvis and extending surrounding the spleen consistent with the changes involving the right ureter and kidney. This likely represents extravasated urine. Musculoskeletal: Bilateral hip replacements are seen. Degenerative changes of lumbar spine are noted. IMPRESSION: Dilatation of the right ureter and renal collecting system with evidence of perinephric stranding and Peri-ureteral fluid likely related to obstructive change with caliceal rupture. An obstructing stone is not visualized at this time. No other focal abnormality is noted. Electronically Signed   By: Alcide CleverMark  Lukens M.D.   On: 06/09/2018 13:59   Dg Chest 2 View  Result Date: 05/31/2018 CLINICAL DATA:  Shortness of breath, chest pain EXAM: CHEST - 2 VIEW COMPARISON:  08/02/2016 FINDINGS: There is right upper lobe airspace disease most concerning for pneumonia. The lungs are hyperinflated likely secondary to COPD. There is no pleural effusion or pneumothorax. The heart and mediastinal contours are unremarkable. The osseous structures are unremarkable. IMPRESSION: Right upper lobe pneumonia. Followup PA and lateral chest X-ray is recommended in 3-4 weeks following trial of antibiotic therapy to ensure resolution and exclude underlying malignancy. Electronically Signed   By: Elige KoHetal   Fayth Trefry   On:  05/31/2018 12:44       Today   Subjective:   Wesley AversMarc Wisher patient doing well denies any complaints Objective:   Blood pressure (!) 147/90, pulse 97, temperature 97.8 F (36.6 C), temperature source Oral, resp. rate 18, height 5\' 8"  (1.727 m), weight 79.4 kg, SpO2 97 %.  .  Intake/Output Summary (Last 24 hours) at 06/11/2018 1259 Last data filed at 06/11/2018 1137 Gross per 24 hour  Intake 2364.06 ml  Output 3875 ml  Net -1510.94 ml    Exam VITAL SIGNS: Blood pressure (!) 147/90, pulse 97, temperature 97.8 F (36.6 C), temperature source Oral, resp. rate 18, height 5\' 8"  (1.727 m), weight 79.4 kg, SpO2 97 %.  GENERAL:  68 y.o.-year-old patient lying in the bed with no acute distress.  EYES: Pupils equal, round, reactive to light and accommodation. No scleral icterus. Extraocular muscles intact.  HEENT: Head atraumatic, normocephalic. Oropharynx and nasopharynx clear.  NECK:  Supple, no jugular venous distention. No thyroid enlargement, no tenderness.  LUNGS: Normal breath sounds bilaterally, no wheezing, rales,rhonchi or crepitation. No use of accessory muscles of respiration.  CARDIOVASCULAR: S1, S2 normal. No murmurs, rubs, or gallops.  ABDOMEN: Soft, nontender, nondistended. Bowel sounds present. No organomegaly or mass.  EXTREMITIES: No pedal edema, cyanosis, or clubbing.  NEUROLOGIC: Cranial nerves II through XII are intact. Muscle strength 5/5 in all extremities. Sensation intact. Gait not checked.  PSYCHIATRIC: The patient is alert and oriented x 3.  SKIN: No obvious rash, lesion, or ulcer.   Data Review     CBC w Diff:  Lab Results  Component Value Date   WBC 7.9 06/10/2018   HGB 11.7 (L) 06/10/2018   HCT 35.8 (L) 06/10/2018   PLT 404 (H) 06/10/2018   CMP:  Lab Results  Component Value Date   NA 133 (L) 06/11/2018   K 4.7 06/11/2018   CL 99 06/11/2018   CO2 28 06/11/2018   BUN 16 06/11/2018   CREATININE 1.09 06/11/2018   PROT 6.3 (L)  06/10/2018   ALBUMIN 3.0 (L) 06/10/2018   BILITOT 1.0 06/10/2018   ALKPHOS 57 06/10/2018   AST 20 06/10/2018   ALT 18 06/10/2018  .  Micro Results No results found for this or any previous visit (from the past 240 hour(s)).      Code Status Orders  (From admission, onward)         Start     Ordered   06/09/18 1929  Full code  Continuous     06/09/18 1929        Code Status History    This patient has a current code status but no historical code status.          Follow-up Information    Stoioff, Verna CzechScott C, MD Follow up in 7 day(s).   Specialty:  Urology Why:  voiding trial urinary rention Contact information: [address] [place] [address] [phone]           Discharge Medications   Allergies as of 06/11/2018   No Known Allergies     Medication List    TAKE these medications   ADVAIR HFA 115-21 MCG/ACT inhaler Generic drug:  fluticasone-salmeterol Inhale into the lungs.   albuterol (2.5 MG/3ML) 0.083% nebulizer solution Commonly known as:  PROVENTIL Take 3 mLs (2.5 mg total) by nebulization every 6 (six) hours as needed for wheezing or shortness of breath.   alum & mag hydroxide-simeth 200-200-20 MG/5ML suspension Commonly known as:  MAALOX/MYLANTA Take 30 mLs by mouth once for 1 dose.   aspirin EC 81 MG tablet Take by mouth.   benzonatate 100 MG capsule Commonly known as:  TESSALON PERLES Take 1 capsule (100 mg total) by mouth 3 (three) times daily as needed for cough.   ciprofloxacin 500 MG tablet Commonly known as:  CIPRO Take 500 mg by mouth 2 (two) times daily.   ketoconazole 2 % cream Commonly known as:  NIZORAL Apply 1 application topically daily.   lisinopril 10 MG tablet Commonly known as:  PRINIVIL,ZESTRIL Take by mouth.   multivitamin capsule Take by mouth.   tamsulosin 0.4 MG Caps capsule Commonly known as:  FLOMAX Take 1 capsule (0.4 mg total) by mouth daily.   triamcinolone acetonide 40  MG/ML Susp Commonly known as:  TRIESENCE Inject into the articular space.          Total Time in preparing paper work, data evaluation and todays exam - 35 minutes  Auburn Bilberry M.D on 06/11/2018 at 12:59 PM [facility]   Office  [phone]

## 2018-06-11 NOTE — Progress Notes (Signed)
[facility]  ROUNDING NOTE   Subjective:  Patient doing quite well. Creatinine down to 1.09. Urine output was 3.1 L over the preceding 24 hours. Patient in good spirits.   Objective:  Vital signs in last 24 hours:  Temp:  [97.8 F (36.6 C)-98.5 F (36.9 C)] 97.8 F (36.6 C) (01/18 0531) Pulse Rate:  [97-113] 97 (01/18 0531) Resp:  [18-20] 18 (01/18 0531) BP: (130-148)/(79-90) 147/90 (01/18 0531) SpO2:  [97 %] 97 % (01/18 0531)  Weight change:  Filed Weights   06/09/18 0945  Weight: 79.4 kg    Intake/Output: I/O last 3 completed shifts: In: 3637 [P.O.:720; I.V.:2917] Out: 7375 [Urine:7375]   Intake/Output this shift:  Total I/O In: 330 [P.O.:330] Out: 1250 [Urine:1250]  Physical Exam: General: No acute distress  Head: Normocephalic, atraumatic. Moist oral mucosal membranes  Eyes: Anicteric  Neck: Supple, trachea midline  Lungs:  Clear to auscultation, normal effort  Heart: S1S2 no rubs  Abdomen:  Soft, nontender, bowel sounds present  Extremities: No peripheral edema.  Neurologic: Awake, alert, following commands  Skin: No lesions       Basic Metabolic Panel: Recent Labs  Lab 06/09/18 0948 06/09/18 1915 06/09/18 2208 06/10/18 0352 06/11/18 0503  NA 122* 134* 136 139 133*  K 5.4*  --   --  4.9 4.7  CL 87*  --   --  107 99  CO2 19*  --   --  27 28  GLUCOSE 118*  --   --  112* 111*  BUN 51*  --   --  28* 16  CREATININE 5.34*  --   --  1.76* 1.09  CALCIUM 8.6*  --   --  9.1 8.7*  MG  --   --   --  2.2  --     Liver Function Tests: Recent Labs  Lab 06/10/18 0352  AST 20  ALT 18  ALKPHOS 57  BILITOT 1.0  PROT 6.3*  ALBUMIN 3.0*   No results for input(s): LIPASE, AMYLASE in the last 168 hours. No results for input(s): AMMONIA in the last 168 hours.  CBC: Recent Labs  Lab 06/09/18 0948 06/10/18 0352  WBC 17.8* 7.9  HGB 12.9* 11.7*  HCT 37.6* 35.8*  MCV 90.8 94.0  PLT 473* 404*    Cardiac Enzymes: No results for  input(s): CKTOTAL, CKMB, CKMBINDEX, TROPONINI in the last 168 hours.  BNP: Invalid input(s): POCBNP  CBG: No results for input(s): GLUCAP in the last 168 hours.  Microbiology: Results for orders placed or performed in visit on 09/09/17  Microscopic Examination     Status: Abnormal   Collection Time: 09/09/17  9:46 AM  Result Value Ref Range Status   WBC, UA None seen 0 - 5 /hpf Final   RBC, UA None seen 0 - 2 /hpf Final   Epithelial Cells (non renal) None seen 0 - 10 /hpf Final   Mucus, UA Present (A) Not Estab. Final   Bacteria, UA Few (A) None seen/Few Final    Coagulation Studies: No results for input(s): LABPROT, INR in the last 72 hours.  Urinalysis: Recent Labs    06/09/18 1246  COLORURINE YELLOW*  LABSPEC 1.011  PHURINE 7.0  GLUCOSEU NEGATIVE  HGBUR NEGATIVE  BILIRUBINUR NEGATIVE  KETONESUR NEGATIVE  PROTEINUR NEGATIVE  NITRITE NEGATIVE  LEUKOCYTESUR NEGATIVE      Imaging: Ct Abdomen Pelvis Wo Contrast  Result Date: 06/09/2018 CLINICAL DATA:  Difficulty voiding EXAM: CT ABDOMEN AND PELVIS WITHOUT CONTRAST TECHNIQUE: Multidetector  CT imaging of the abdomen and pelvis was performed following the standard protocol without IV contrast. COMPARISON:  11/19/2013 FINDINGS: Lower chest: No acute abnormality. Hepatobiliary: No focal liver abnormality is seen. No gallstones, gallbladder wall thickening, or biliary dilatation. Pancreas: Unremarkable. No pancreatic ductal dilatation or surrounding inflammatory changes. Spleen: Spleen is within normal limits. Mild perisplenic fluid is noted. Adrenals/Urinary Tract: Adrenal glands are within normal limits. Left kidney demonstrates no renal calculi or obstructive changes. The ureter appears within normal limits. The right kidney demonstrates some perinephric changes as well as fullness of the collecting system. Fluid is noted tracking along the course of the right ureter and kidney towards the bladder. The right ureter is distended  to the level of the urinary bladder. The bladder is decompressed by Foley catheter. Stomach/Bowel: No obstructive or inflammatory changes of the bowel are noted. The appendix is within normal limits. The stomach is decompressed. Vascular/Lymphatic: Aortic atherosclerosis. No enlarged abdominal or pelvic lymph nodes. Reproductive: Prostate is unremarkable. Other: Mild free fluid is noted within the pelvis and extending surrounding the spleen consistent with the changes involving the right ureter and kidney. This likely represents extravasated urine. Musculoskeletal: Bilateral hip replacements are seen. Degenerative changes of lumbar spine are noted. IMPRESSION: Dilatation of the right ureter and renal collecting system with evidence of perinephric stranding and Peri-ureteral fluid likely related to obstructive change with caliceal rupture. An obstructing stone is not visualized at this time. No other focal abnormality is noted. Electronically Signed   By: Alcide Clever M.D.   On: 06/09/2018 13:59     Medications:    . aspirin EC  81 mg Oral Daily  . enoxaparin (LOVENOX) injection  40 mg Subcutaneous Q24H  . mometasone-formoterol  2 puff Inhalation BID  . multivitamin with minerals  1 tablet Oral Daily  . polyethylene glycol  17 g Oral Daily  . tamsulosin  0.4 mg Oral Daily   acetaminophen **OR** acetaminophen, albuterol, ondansetron **OR** ondansetron (ZOFRAN) IV, traMADol  Assessment/ Plan:  68 y.o. male  with a PMHx of chronic BPH, sarcoidosis, hypertension, osteoarthritis, who was admitted to [facility] on 06/09/2018 for evaluation of inability to urinate.  1.  Acute renal failure due to urinary retention. 2.  Urinary retention likely due to BPH. 3.  Calyceal rupture, urology following. 4.  Hypertension.  Plan:  Patient doing much better with Foley decompression.  Urine output was 3.1 L over the preceding 24 hours.  Leave Foley catheter in place.  Acute renal failure appears to have resolved his  creatinine is down to 1.09.  He will need continued urologic follow-up as an outpatient.  Patient has also been started on Flomax at this time.  Further plan as per hospitalist.  Thanks for allowing [place] to participate.   LOS: 2 Chiniqua Kilcrease 1/18/202011:42 AM

## 2018-06-11 NOTE — Progress Notes (Signed)
Pt discharged per MD order. Foley catheter care education done with pt and his wife. RN demonstrated how pt is to alternate between the leg bag and the standard drainage bag. Pt and wife verbalized understanding. Discharge paperwork reviewed with pt. All questions answered to pt and wife's satisfaction. Pt declined wheelchair and walked downstairs.

## 2018-06-13 ENCOUNTER — Telehealth: Payer: Self-pay | Admitting: Urology

## 2018-06-13 NOTE — Telephone Encounter (Signed)
-----   Message from Sondra ComeBrian C Sninsky, MD sent at 06/09/2018  5:59 PM EST ----- Regarding: follow up Patient of Dr. Lonna CobbStoioff currently admitted for acute urinary retention.  Please schedule follow-up on nurse schedule for foley removal and voiding trial in 7 to 10 days, as well as follow-up with Dr. Lonna CobbStoioff with [facility] in 4 weeks.  Legrand RamsBrian Sninsky, MD 06/09/2018

## 2018-06-13 NOTE — Telephone Encounter (Signed)
appts have been made already    [place]

## 2018-06-16 ENCOUNTER — Telehealth: Payer: Self-pay | Admitting: Urology

## 2018-06-16 ENCOUNTER — Ambulatory Visit (INDEPENDENT_AMBULATORY_CARE_PROVIDER_SITE_OTHER): Payer: Medicare Other

## 2018-06-16 VITALS — BP 154/119 | HR 116 | Ht 67.0 in | Wt 168.0 lb

## 2018-06-16 DIAGNOSIS — R339 Retention of urine, unspecified: Secondary | ICD-10-CM | POA: Diagnosis not present

## 2018-06-16 LAB — BLADDER SCAN AMB NON-IMAGING

## 2018-06-16 NOTE — Progress Notes (Signed)
Patient presents back to the clinic this afternoon stating he has not been able to void since this morning. Bladder scan patient unable to void  Per Dr. Lonna Cobb patient was given the option for CIC vs cath replacement follow up Cysto for further evaluation. Patient would like to try CIC  Continuous Intermittent Catheterization  Due to urinary retention patient is present today for a teaching of self I & O Catheterization. Patient was given detailed verbal and printed instructions of self catheterization. Patient was cleaned and prepped in a sterile fashion.  With instruction and assistance patient inserted a 14 flex coude FR and urine return was noted 500 ml, urine was yellow in color. Patient tolerated well, no complications were noted  Preformed by: Eligha Bridegroom, CMA  Patient states that he does feel comfortable with CIC but has family in town and would like the foley replaced for convenience.  Simple Catheter Placement  Due to urinary retention patient is present today for a foley cath placement.  Patient was cleaned and prepped in a sterile fashion with betadine and lidocaine jelly 2% was instilled into the urethra.  A 16 coude FR foley catheter was inserted, urine return was noted  10ml, urine was yellow in color.  The balloon was filled with 10cc of sterile water.  A leg bag was attached for drainage. Patient was also given a night bag to take home and was given instruction on how to change from one bag to another.  Patient was given instruction on proper catheter care.  Patient tolerated well, no complications were noted   Preformed by: Eligha Bridegroom, CMA  Additional notes/ Follow up: Per Dr. Lonna Cobb patient was instructed to continue Flomax and patient was scheduled for cysto next week and will attempt V&T at that visit

## 2018-06-16 NOTE — Progress Notes (Signed)
Fill and Pull Catheter Removal  Patient is present today for a catheter removal.  Patient was cleaned and prepped in a sterile fashion 120ml of sterile water/ saline was instilled into the bladder when the patient felt the urge to urinate. 10ml of water was then drained from the balloon.  A 16FR foley cath was removed from the bladder no complications were noted .  Patient as then given some time to void on their own.  Patient can void  100ml on their own after some time.  Patient tolerated well.  Preformed by: Delena BaliKatie Lindsay Straka, CMA  Follow up/ Additional notes: as scheduled with Dr Lonna CobbStoioff in February

## 2018-06-16 NOTE — Addendum Note (Signed)
Addended by: Martha Clan on: 06/16/2018 01:40 PM   Modules accepted: Orders

## 2018-06-16 NOTE — Telephone Encounter (Signed)
Pt left vague message on vm stating he was instructed to call if he was having issues. No details given. Called pt asked what issues are, pt stated he has drank 1300 ml of water and feels the urge to urinate but cannot. He states he feels retention in his bladder. Advised pt to come back to office, per protocol.

## 2018-06-21 ENCOUNTER — Ambulatory Visit (INDEPENDENT_AMBULATORY_CARE_PROVIDER_SITE_OTHER): Payer: Medicare Other | Admitting: Urology

## 2018-06-21 ENCOUNTER — Encounter: Payer: Self-pay | Admitting: Urology

## 2018-06-21 VITALS — BP 151/71 | HR 116 | Ht 67.0 in | Wt 171.6 lb

## 2018-06-21 DIAGNOSIS — R339 Retention of urine, unspecified: Secondary | ICD-10-CM

## 2018-06-21 DIAGNOSIS — R338 Other retention of urine: Secondary | ICD-10-CM | POA: Diagnosis not present

## 2018-06-21 DIAGNOSIS — N401 Enlarged prostate with lower urinary tract symptoms: Secondary | ICD-10-CM | POA: Diagnosis not present

## 2018-06-21 MED ORDER — LIDOCAINE HCL URETHRAL/MUCOSAL 2 % EX GEL
1.0000 "application " | Freq: Once | CUTANEOUS | Status: AC
  Administered 2018-06-21: 1 via URETHRAL

## 2018-06-21 NOTE — Progress Notes (Signed)
   06/21/18  CC:  Chief Complaint  Patient presents with  . Cysto    HPI: 68 year old male with urinary retention.  He has failed one voiding trial  Blood pressure (!) 151/71, pulse (!) 116, height 5\' 7"  (1.702 m), weight 171 lb 9.6 oz (77.8 kg). NED. A&Ox3.   No respiratory distress   Abd soft, NT, ND Normal phallus with bilateral descended testicles  Cystoscopy Procedure Note  Patient identification was confirmed, informed consent was obtained, and patient was prepped using Betadine solution.  Lidocaine jelly was administered per urethral meatus.     Pre-Procedure: - Inspection reveals a normal caliber ureteral meatus.  Procedure: The flexible cystoscope was introduced without difficulty - No urethral strictures/lesions are present. - Touching lateral lobes prostate; 4 cm prostatic urethra - Elevated bladder neck - Bilateral ureteral orifices identified - Bladder mucosa  reveals no ulcers, tumors, or lesions - No bladder stones -Moderate trabeculation  Retroflexion shows no intravesical median lobe   Post-Procedure: - Patient tolerated the procedure well  Assessment/ Plan: Urinary retention with significant prostate enlargement on cystoscopy.  He was unable to void post cystoscopy and decline intermittent catheterization which she was taught last week.  His Foley catheter was replaced.  He will return for transrectal ultrasound prostate for volume determination.   Riki Altes, MD

## 2018-06-21 NOTE — Patient Instructions (Signed)
Cystoscopy  Cystoscopy is a procedure that is used to help diagnose and sometimes treat conditions that affect that lower urinary tract. The lower urinary tract includes the bladder and the tube that drains urine from the bladder out of the body (urethra). Cystoscopy is performed with a thin, tube-shaped instrument with a light and camera at the end (cystoscope). The cystoscope may be hard (rigid) or flexible, depending on the goal of the procedure.The cystoscope is inserted through the urethra, into the bladder. Cystoscopy may be recommended if you have:  Urinary tractinfections that keep coming back (recurring).  Blood in the urine (hematuria).  Loss of bladder control (urinary incontinence) or an overactive bladder.  Unusual cells found in a urine sample.  A blockage in the urethra.  Painful urination.  An abnormality in the bladder found during an intravenous pyelogram (IVP) or CT scan. Cystoscopy may also be done to remove a sample of tissue to be examined under a microscope (biopsy). Tell a health care provider about:  Any allergies you have.  All medicines you are taking, including vitamins, herbs, eye drops, creams, and over-the-counter medicines.  Any problems you or family members have had with anesthetic medicines.  Any blood disorders you have.  Any surgeries you have had.  Any medical conditions you have.  Whether you are pregnant or may be pregnant. What are the risks? Generally, this is a safe procedure. However, problems may occur, including:  Infection.  Bleeding.  Allergic reactions to medicines.  Damage to other structures or organs. What happens before the procedure?  Ask your health care provider about: ? Changing or stopping your regular medicines. This is especially important if you are taking diabetes medicines or blood thinners. ? Taking medicines such as aspirin and ibuprofen. These medicines can thin your blood. Do not take these medicines  before your procedure if your health care provider instructs you not to.  Follow instructions from your health care provider about eating or drinking restrictions.  You may be given antibiotic medicine to help prevent infection.  You may have an exam or testing, such as X-rays of the bladder, urethra, or kidneys.  You may have urine tests to check for signs of infection.  Plan to have someone take you home after the procedure. What happens during the procedure?  To reduce your risk of infection,your health care team will wash or sanitize their hands.  You will be given one or more of the following: ? A medicine to help you relax (sedative). ? A medicine to numb the area (local anesthetic).  The area around the opening of your urethra will be cleaned.  The cystoscope will be passed through your urethra into your bladder.  Germ-free (sterile)fluid will flow through the cystoscope to fill your bladder. The fluid will stretch your bladder so that your surgeon can clearly examine your bladder walls.  The cystoscope will be removed and your bladder will be emptied. The procedure may vary among health care providers and hospitals. What happens after the procedure?  You may have some soreness or pain in your abdomen and urethra. Medicines will be available to help you.  You may have some blood in your urine.  Do not drive for 24 hours if you received a sedative. This information is not intended to replace advice given to you by your health care provider. Make sure you discuss any questions you have with your health care provider. Document Released: 05/08/2000 Document Revised: 02/19/2017 Document Reviewed: 03/28/2015 Elsevier Interactive Patient   Education  2019 [employer] Acute Urinary Retention, Male  Acute urinary retention means that you cannot pee (urinate) at all, or that you pee too little and your bladder is not emptied completely. If it is not treated, it can lead to  kidney damage or other serious problems. Follow these instructions at home:  Take over-the-counter and prescription medicines only as told by your doctor. Ask your doctor what medicines you should stay away from. Do not take any medicine unless your doctor says it is okay to do so.  If you were sent home with a tube that drains the bladder (catheter), take care of it as told by your doctor.  Drink enough fluid to keep your pee clear or pale yellow.  If you were given an antibiotic, take it as told by your doctor. Do not stop taking the antibiotic even if you start to feel better.  Do not use any products that contain nicotine or tobacco, such as cigarettes and e-cigarettes. If you need help quitting, ask your doctor.  Watch for changes in your symptoms. Tell your doctor about them.  If told, track changes in your blood pressure at home. Tell your doctor about them.  Keep all follow-up visits as told by your doctor. This is important. Contact a doctor if:  You have spasms or you leak pee when you have spasms. Get help right away if:  You have chills or a fever.  You have a tube that drains the bladder and: ? The tube stops draining pee. ? The tube falls out.  You have blood in your pee. Summary  Acute urinary retention means that you cannot pee at all, or that you pee too little and your bladder is not emptied completely. If it is not treated, it can result in kidney damage or other serious problems.  If you were sent home with a tube that drains the bladder, take care of it as told by your doctor.  Monitor any changes in your symptoms. Tell your doctor about any changes. This information is not intended to replace advice given to you by your health care provider. Make sure you discuss any questions you have with your health care provider. Document Released: 10/28/2007 Document Revised: 06/12/2016 Document Reviewed: 06/12/2016 Elsevier Interactive Patient Education  2019  [employer].

## 2018-06-21 NOTE — Progress Notes (Signed)
Cath Change/ Replacement  Patient is present today for a catheter change due to urinary retention.  88ml of water was removed from the balloon, a 16FR Coude foley cath was removed with out difficulty.  Patient was cleaned and prepped in a sterile fashion with betadine and 2% lidocaine jelly was instilled into the urethra. A 16 FR Coude foley cath was replaced into the bladder no complications were noted Urine return was noted 68ml and urine was yellow in color. The balloon was filled with 46ml of sterile water. A leg bag was attached for drainage.  A night bag was also given to the patient and patient was given instruction on how to change from one bag to another. Patient was given proper instruction on catheter care.    Preformed by: Debbe Bales, CMA (AAMA)  Follow up: As scheduled

## 2018-06-22 ENCOUNTER — Telehealth: Payer: Self-pay | Admitting: Urology

## 2018-06-23 ENCOUNTER — Other Ambulatory Visit: Payer: Self-pay | Admitting: Radiology

## 2018-06-23 DIAGNOSIS — R339 Retention of urine, unspecified: Secondary | ICD-10-CM

## 2018-06-27 ENCOUNTER — Telehealth: Payer: Self-pay | Admitting: Urology

## 2018-06-27 MED ORDER — TAMSULOSIN HCL 0.4 MG PO CAPS: 0.4000 mg | ORAL_CAPSULE | Freq: Every day | ORAL | 3 refills | Status: DC

## 2018-06-27 NOTE — Telephone Encounter (Signed)
Patient notified [facility]

## 2018-06-27 NOTE — Telephone Encounter (Signed)
Patient called the office today requesting a prescription for Tamsulosin to be sent to [facility].

## 2018-06-28 ENCOUNTER — Ambulatory Visit (INDEPENDENT_AMBULATORY_CARE_PROVIDER_SITE_OTHER): Payer: Medicare Other | Admitting: Urology

## 2018-06-28 ENCOUNTER — Encounter: Payer: Self-pay | Admitting: Urology

## 2018-06-28 ENCOUNTER — Other Ambulatory Visit: Payer: Self-pay | Admitting: Urology

## 2018-06-28 ENCOUNTER — Encounter

## 2018-06-28 VITALS — BP 129/78 | HR 112 | Ht 67.0 in | Wt 171.0 lb

## 2018-06-28 DIAGNOSIS — N401 Enlarged prostate with lower urinary tract symptoms: Secondary | ICD-10-CM | POA: Diagnosis not present

## 2018-06-28 DIAGNOSIS — R338 Other retention of urine: Secondary | ICD-10-CM | POA: Diagnosis not present

## 2018-06-29 NOTE — Progress Notes (Signed)
06/28/2018 7:09 AM   Wesley Barnes 12-09-1950 [id number]  Referring provider: Jerl Mina, MD [address] [facility] [place], [place] [address]  Chief Complaint  Patient presents with  . TRUS    HPI: 68 year old male presents for follow-up of urinary retention.  Cystoscopy performed last week showed touching lateral lobes and a 4 cm prostatic urethra.  He had a urodynamic study performed on 06/27/2018.  Bladder capacity was 340 mL.  He was able to generate a well sustained voluntary detrusor contraction but was only able to void 6 mL.  His max detrusor pressure was 61 H2O.   PMH: Past Medical History:  Diagnosis Date  . Hypertension     Surgical History: Past Surgical History:  Procedure Laterality Date  . COLONOSCOPY  2000  . HERNIA REPAIR  2001  . HIP SURGERY Right    2016    Home Medications:  Allergies as of 06/28/2018   No Known Allergies     Medication List       Accurate as of June 28, 2018 11:59 PM. Always use your most recent med list.        ADVAIR HFA 115-21 MCG/ACT inhaler Generic drug:  fluticasone-salmeterol Inhale into the lungs.   albuterol (2.5 MG/3ML) 0.083% nebulizer solution Commonly known as:  PROVENTIL Take 3 mLs (2.5 mg total) by nebulization every 6 (six) hours as needed for wheezing or shortness of breath.   aspirin EC 81 MG tablet Take by mouth.   benzonatate 100 MG capsule Commonly known as:  TESSALON PERLES Take 1 capsule (100 mg total) by mouth 3 (three) times daily as needed for cough.   ketoconazole 2 % cream Commonly known as:  NIZORAL Apply 1 application topically daily.   lisinopril 10 MG tablet Commonly known as:  PRINIVIL,ZESTRIL Take by mouth.   multivitamin capsule Take by mouth.   tamsulosin 0.4 MG Caps capsule Commonly known as:  FLOMAX Take 1 capsule (0.4 mg total) by mouth daily.   triamcinolone acetonide 40 MG/ML Susp Commonly known as:  TRIESENCE Inject into the articular space.         Allergies: No Known Allergies  Family History: No family history on file.  Social History:  reports that he has quit smoking. He has never used smokeless tobacco. He reports current alcohol use. He reports that he does not use drugs.   Physical Exam: BP 129/78 (BP Location: Left Arm, Patient Position: Sitting, Cuff Size: Normal)   Pulse (!) 112   Ht 5\' 7"  (1.702 m)   Wt 171 lb (77.6 kg)   BMI 26.78 kg/m   Constitutional:  Alert and oriented, No acute distress.  TRUS: A transrectal ultrasound probe was lubricated and gently inserted per rectum.  Imaging was performed and transverse and sagittal views.  No echogenic abnormalities of the peripheral zone were identified.  There was transition zone enlargement.  Volume was calculated at 85 cc.   Assessment & Plan:   68 year old male with acute urinary retention.  Urodynamic study is consistent with obstruction.  Significant prostate volume on TRUS.  He has failed voiding trials x2.  Surgical options were discussed including TURP and HoLep.  The pros and cons of each treatment were discussed.  He was interested in pursuing holmium laser enucleation prostate.  Will schedule a preop appointment with Dr. Richardo Hanks.  All of his questions were answered.   Riki Altes, MD  [facility] [address], [address] [place], [place] [address] [phone]  

## 2018-07-01 ENCOUNTER — Encounter: Payer: Self-pay | Admitting: Urology

## 2018-07-01 ENCOUNTER — Other Ambulatory Visit: Payer: Medicare Other | Admitting: Urology

## 2018-07-01 ENCOUNTER — Ambulatory Visit (INDEPENDENT_AMBULATORY_CARE_PROVIDER_SITE_OTHER): Payer: Medicare Other | Admitting: Urology

## 2018-07-01 VITALS — BP 126/84 | HR 109 | Wt 174.0 lb

## 2018-07-01 DIAGNOSIS — R339 Retention of urine, unspecified: Secondary | ICD-10-CM | POA: Diagnosis not present

## 2018-07-01 LAB — URINALYSIS, COMPLETE
Bilirubin, UA: NEGATIVE
Glucose, UA: NEGATIVE
Ketones, UA: NEGATIVE
Nitrite, UA: POSITIVE — AB
PH UA: 7 (ref 5.0–7.5)
Specific Gravity, UA: 1.02 (ref 1.005–1.030)
Urobilinogen, Ur: 0.2 mg/dL (ref 0.2–1.0)

## 2018-07-01 LAB — MICROSCOPIC EXAMINATION
BACTERIA UA: NONE SEEN
Epithelial Cells (non renal): NONE SEEN /hpf (ref 0–10)

## 2018-07-01 NOTE — Progress Notes (Signed)
   07/01/2018 12:35 PM   Wesley Barnes 27-Oct-1950 [id number]  Reason for visit: Urinary retention, discuss HOLEP  HPI: Wesley Barnes is a 68 year old healthy male with a history of a weak urinary stream who presented to the emergency department in acute urinary retention and AKI with upstream right-sided calyceal rupture 06/09/2018.  He underwent Foley placement and renal function normalized.  CT at that time showed a 90g prostate.  He has a history of a mildly elevated PSA of 4.4 in April 2019, with PSA density of 0.05.  He has not undergone prostate biopsy previously.  He failed multiple void trials over the last month despite addition of Flomax.  He recently had urodynamics performed in [place] that showed bladder outlet obstruction with high pressure low flow voiding.  Cystoscopy with Dr. Lonna Cobb showed 4 cm prostatic length with lateral lobe hypertrophy, no median lobe, no bladder stones, no bladder lesions.  We discussed the risks and benefits of HOLEP at length.  The procedure requires general anesthesia and takes 2 to 3 hours, and a holmium laser is used to enucleate the prostate and push this tissue into the bladder.  A morcellator is then used to remove this tissue, which is sent for pathology.  Majority of patients are able to discharge the same day with a catheter in place for 2 to 3 days, and will follow-up in clinic for a voiding trial.  Approximately 10% of patients will be admitted overnight to monitor the urine, or if they have multiple co-morbidities.  We specifically discussed the risks of bleeding, infection, retrograde ejaculation, temporary urgency and urge incontinence, very low risk of long-term incontinence, and possible need for additional procedures.  We also discussed his history of mildly elevated PSA to 4.4.  He has no family history of prostate cancer.  I discussed that standard of care would be to either repeat the PSA, or undergo prostate biopsy to rule out prostate  cancer, as the management of BPH and prostate cancer are very different, especially in the setting of bladder outlet obstruction.  He is extremely bothered by the catheter and has been out of work and does not want to undergo prostate biopsy.  He would like to pursue HOLEP, and understands the risk of possible missed diagnosis of prostate cancer.  We discussed this at length.  -Schedule HOLEP -Foley change next week to decrease bacterial burden -Send urine for culture today  A total of 15 minutes were spent face-to-face with the patient, greater than 50% was spent in patient education, counseling, and coordination of care regarding elevated PSA, prostate cancer screening, bladder outlet obstruction, and risks and benefits of HOLEP.  [name], MD  [facility] [address] [place], [place] [address] [phone]

## 2018-07-05 ENCOUNTER — Ambulatory Visit (INDEPENDENT_AMBULATORY_CARE_PROVIDER_SITE_OTHER): Payer: Medicare Other

## 2018-07-05 ENCOUNTER — Other Ambulatory Visit: Payer: Self-pay | Admitting: Radiology

## 2018-07-05 ENCOUNTER — Telehealth: Payer: Self-pay

## 2018-07-05 ENCOUNTER — Telehealth: Payer: Self-pay | Admitting: Urology

## 2018-07-05 DIAGNOSIS — N401 Enlarged prostate with lower urinary tract symptoms: Secondary | ICD-10-CM

## 2018-07-05 DIAGNOSIS — R338 Other retention of urine: Principal | ICD-10-CM

## 2018-07-05 DIAGNOSIS — R339 Retention of urine, unspecified: Secondary | ICD-10-CM | POA: Diagnosis not present

## 2018-07-05 LAB — CULTURE, URINE COMPREHENSIVE

## 2018-07-05 MED ORDER — SULFAMETHOXAZOLE-TRIMETHOPRIM 800-160 MG PO TABS: 1.0000 | ORAL_TABLET | Freq: Two times a day (BID) | ORAL | 0 refills | Status: DC

## 2018-07-05 NOTE — Progress Notes (Signed)
Cath Change/ Replacement  Patient is present today for a catheter change due to urinary retention.  57ml of water was removed from the balloon, a 16FR Coude foley cath was removed with out difficulty.  Patient was cleaned and prepped in a sterile fashion with betadine. A 16FR Coude foley cath was replaced into the bladder no complications were noted. Urine return was noted 59ml and urine was clear in color. The balloon was filled with 21ml of sterile water. A leg bag was attached for drainage.  A night bag was also given to the patient and patient was given instruction on how to change from one bag to another. Patient was given proper instruction on catheter care.    Preformed by: Jerelene Redden, CMA (AAMA)  Follow up: As scheduled

## 2018-07-05 NOTE — Telephone Encounter (Signed)
Patient notified and script was sent 

## 2018-07-05 NOTE — Telephone Encounter (Signed)
-----   Message from [name], MD sent at 07/05/2018  3:35 PM EST ----- Please start him on bactrim DS BID x 4 days in anticipation of HOLEP surgery this Friday.  Legrand Rams, MD 07/05/2018

## 2018-07-05 NOTE — Patient Instructions (Signed)

## 2018-07-05 NOTE — Telephone Encounter (Signed)
Patient was given the [facility] Surgery Information form below as well as the Instructions for Pre-Admission Testing form & a map of [facility].   [address], [address] [place], [place] [address] Telephone: [phone] Fax: [phone]   Thank you for choosing [facility] for your upcoming surgery!  We are always here to assist in your urological needs.  Please read the following information with specific details for your upcoming appointments related to your surgery. Please contact Amy at [phone] Option 3 with any questions.  The Name of Your Surgery: Holmium Laser Enucleation of the Prostate [facility]) Your Surgery Date: 07/08/18 Your Surgeon: Legrand Rams  Please call Same Day Surgery at [phone] between the hours of 1pm-3pm one day prior to your surgery. They will inform you of the time to arrive at Same Day Surgery which is located on the second floor of the [facility].   Please refer to the attached letter regarding instructions for Pre-Admission Testing. You will receive a call from the Pre-Admission Testing office regarding your appointment with them.  The Pre-Admission Testing office is located at [address] [facility], on the first floor of the [facility] in [address] (office is to the right as you enter through the [employer] of the [employer]). Please have all medications you are currently taking and your insurance card available.   Patient was advised to have nothing to eat or drink after midnight the night prior to surgery except that he may have only water until 2 hours before surgery with nothing to drink within 2 hours of surgery.  The patient states he currently takes 81mg  aspirin & was informed to hold medication for 7 days prior to surgery beginning on 07/01/18. Patient's questions were answered and he expressed  understanding of these instructions.

## 2018-07-06 ENCOUNTER — Other Ambulatory Visit: Payer: Self-pay

## 2018-07-06 ENCOUNTER — Encounter
Admission: RE | Admit: 2018-07-06 | Discharge: 2018-07-06 | Disposition: A | Discharge status: Home / Self Care | Payer: Medicare Other | Source: Ambulatory Visit | Attending: Urology | Admitting: Urology

## 2018-07-06 DIAGNOSIS — Z01818 Encounter for other preprocedural examination: Secondary | ICD-10-CM

## 2018-07-06 DIAGNOSIS — Z79899 Other long term (current) drug therapy: Secondary | ICD-10-CM | POA: Diagnosis not present

## 2018-07-06 DIAGNOSIS — N138 Other obstructive and reflux uropathy: Secondary | ICD-10-CM | POA: Diagnosis not present

## 2018-07-06 DIAGNOSIS — I10 Essential (primary) hypertension: Secondary | ICD-10-CM | POA: Insufficient documentation

## 2018-07-06 DIAGNOSIS — D860 Sarcoidosis of lung: Secondary | ICD-10-CM | POA: Diagnosis not present

## 2018-07-06 DIAGNOSIS — Z7982 Long term (current) use of aspirin: Secondary | ICD-10-CM | POA: Diagnosis not present

## 2018-07-06 DIAGNOSIS — N401 Enlarged prostate with lower urinary tract symptoms: Secondary | ICD-10-CM | POA: Diagnosis present

## 2018-07-06 DIAGNOSIS — R338 Other retention of urine: Secondary | ICD-10-CM | POA: Diagnosis not present

## 2018-07-06 DIAGNOSIS — Z87891 Personal history of nicotine dependence: Secondary | ICD-10-CM | POA: Diagnosis not present

## 2018-07-06 DIAGNOSIS — N3289 Other specified disorders of bladder: Secondary | ICD-10-CM | POA: Diagnosis not present

## 2018-07-06 HISTORY — DX: Sarcoidosis of lung: D86.0

## 2018-07-06 LAB — BASIC METABOLIC PANEL
Anion gap: 7 (ref 5–15)
BUN: 16 mg/dL (ref 8–23)
CO2: 28 mmol/L (ref 22–32)
Calcium: 9.3 mg/dL (ref 8.9–10.3)
Chloride: 101 mmol/L (ref 98–111)
Creatinine, Ser: 0.97 mg/dL (ref 0.61–1.24)
GFR calc Af Amer: >60 mL/min (ref >60)
Glucose, Bld: 96 mg/dL (ref 70–99)
POTASSIUM: 4.5 mmol/L (ref 3.5–5.1)
Sodium: 136 mmol/L (ref 135–145)

## 2018-07-06 LAB — CBC
HCT: 41.8 % (ref 39.0–52.0)
HEMOGLOBIN: 13.5 g/dL (ref 13.0–17.0)
MCH: 30.1 pg (ref 26.0–34.0)
MCHC: 32.3 g/dL (ref 30.0–36.0)
MCV: 93.1 fL (ref 80.0–100.0)
Platelets: 224 10*3/uL (ref 150–400)
RBC: 4.49 MIL/uL (ref 4.22–5.81)
RDW: 13.9 % (ref 11.5–15.5)
WBC: 7.1 10*3/uL (ref 4.0–10.5)
nRBC: 0 % (ref 0.0–0.2)

## 2018-07-06 NOTE — Patient Instructions (Signed)
Your procedure is scheduled on: FRI 07/08/2018 Report to DAY SURGERY DEPARTMENT LOCATED ON 2ND FLOOR MEDICAL MALL ENTRANCE. To find out your arrival time please call [phone] between 1PM - 3PM on THUR 07/07/2018.  Remember: Instructions that are not followed completely may result in serious medical risk, up to and including death, or upon the discretion of your surgeon and anesthesiologist your surgery may need to be rescheduled.     _X__ 1. Do not eat food after midnight the night before your procedure.                 No gum chewing or hard candies. You may drink clear liquids up to 2 hours                 before you are scheduled to arrive for your surgery- DO not drink clear                 liquids within 2 hours of the start of your surgery.                 Clear Liquids include:  water, apple juice without pulp, clear carbohydrate                 drink such as Clearfast or Gatorade, Black Coffee or Tea (Do not add                 anything to coffee or tea).  __X__2.  On the morning of surgery brush your teeth with toothpaste and water, you                 may rinse your mouth with mouthwash if you wish.  Do not swallow any              toothpaste of mouthwash.     _X__ 3.  No Alcohol for 24 hours before or after surgery.   _X__ 4.  Do Not Smoke or use e-cigarettes For 24 Hours Prior to Your Surgery.                 Do not use any chewable tobacco products for at least 6 hours prior to                 surgery.  ____  5.  Bring all medications with you on the day of surgery if instructed.   __X__  6.  Notify your doctor if there is any change in your medical condition      (cold, fever, infections).     Do not wear jewelry, make-up, hairpins, clips or nail polish. Do not wear lotions, powders, or perfumes.  Do not shave 48 hours prior to surgery. Men may shave face and neck. Do not bring valuables to the hospital.    [facility] is not responsible for any belongings or  valuables.  Contacts, dentures/partials or body piercings may not be worn into surgery. Bring a case for your contacts, glasses or hearing aids, a denture cup will be supplied. Leave your suitcase in the car. After surgery it may be brought to your room. For patients admitted to the hospital, discharge time is determined by your treatment team.   Patients discharged the day of surgery will not be allowed to drive home.   Please read over the following fact sheets that you were given:   MRSA Information  __X__ Take these medicines the morning of surgery with A SIP OF WATER:  1. tamsulosin (FLOMAX)   2.   3.   4.  5.  6.  ____ Fleet Enema (as directed)   ____ Use CHG Soap/SAGE wipes as directed  ____ Use inhalers on the day of surgery  ____ Stop metformin/Janumet/Farxiga 2 days prior to surgery    ____ Take 1/2 of usual insulin dose the night before surgery. No insulin the morning          of surgery.   ____ Stop Blood Thinners Coumadin/Plavix/Xarelto/Pleta/Pradaxa/Eliquis/Effient/Aspirin  on   Or contact your Surgeon, Cardiologist or Medical Doctor regarding  ability to stop your blood thinners  __X__ Stop Anti-inflammatories 7 days before surgery such as Advil, Ibuprofen, Motrin,  BC or Goodies Powder, Naprosyn, Naproxen, Aleve, Aspirin    __X__ Stop all herbal supplements, fish oil or vitamin E until after surgery.( MULTIVITAMIN OK TO CONTINUE)   ____ Bring C-Pap to the hospital.

## 2018-07-08 ENCOUNTER — Ambulatory Visit: Payer: Medicare Other

## 2018-07-08 ENCOUNTER — Encounter: Payer: Self-pay | Admitting: *Deleted

## 2018-07-08 ENCOUNTER — Other Ambulatory Visit: Payer: Self-pay

## 2018-07-08 ENCOUNTER — Ambulatory Visit
Admission: RE | Admit: 2018-07-08 | Discharge: 2018-07-08 | Disposition: A | Discharge status: Home / Self Care | Payer: Medicare Other | Attending: Urology | Admitting: Urology

## 2018-07-08 ENCOUNTER — Encounter
Admission: RE | Disposition: A | Discharge status: Home / Self Care | Payer: Self-pay | Source: Home / Self Care | Attending: Urology

## 2018-07-08 DIAGNOSIS — R338 Other retention of urine: Secondary | ICD-10-CM

## 2018-07-08 DIAGNOSIS — N3289 Other specified disorders of bladder: Secondary | ICD-10-CM | POA: Insufficient documentation

## 2018-07-08 DIAGNOSIS — I10 Essential (primary) hypertension: Secondary | ICD-10-CM | POA: Insufficient documentation

## 2018-07-08 DIAGNOSIS — Z87891 Personal history of nicotine dependence: Secondary | ICD-10-CM | POA: Insufficient documentation

## 2018-07-08 DIAGNOSIS — N138 Other obstructive and reflux uropathy: Secondary | ICD-10-CM | POA: Diagnosis not present

## 2018-07-08 DIAGNOSIS — N401 Enlarged prostate with lower urinary tract symptoms: Secondary | ICD-10-CM

## 2018-07-08 DIAGNOSIS — Z79899 Other long term (current) drug therapy: Secondary | ICD-10-CM | POA: Insufficient documentation

## 2018-07-08 DIAGNOSIS — Z7982 Long term (current) use of aspirin: Secondary | ICD-10-CM | POA: Insufficient documentation

## 2018-07-08 DIAGNOSIS — D860 Sarcoidosis of lung: Secondary | ICD-10-CM | POA: Insufficient documentation

## 2018-07-08 HISTORY — PX: HOLEP-LASER ENUCLEATION OF THE PROSTATE WITH MORCELLATION: SHX6641

## 2018-07-08 HISTORY — DX: Pneumonia, unspecified organism: J18.9

## 2018-07-08 HISTORY — DX: Benign prostatic hyperplasia without lower urinary tract symptoms: N40.0

## 2018-07-08 SURGERY — ENUCLEATION, PROSTATE, USING LASER, WITH MORCELLATION
Anesthesia: General

## 2018-07-08 MED ORDER — FENTANYL CITRATE (PF) 100 MCG/2ML IJ SOLN
INTRAMUSCULAR | Status: DC | PRN
  Administered 2018-07-08: 25 ug via INTRAVENOUS
  Administered 2018-07-08: 50 ug via INTRAVENOUS
  Administered 2018-07-08: 25 ug via INTRAVENOUS
  Administered 2018-07-08: 50 ug via INTRAVENOUS
  Administered 2018-07-08: 25 ug via INTRAVENOUS

## 2018-07-08 MED ORDER — LIDOCAINE HCL (CARDIAC) PF 100 MG/5ML IV SOSY
PREFILLED_SYRINGE | INTRAVENOUS | Status: DC | PRN
  Administered 2018-07-08: 100 mg via INTRAVENOUS

## 2018-07-08 MED ORDER — ONDANSETRON HCL 4 MG/2ML IJ SOLN
INTRAMUSCULAR | Status: AC
  Filled 2018-07-08: qty 2

## 2018-07-08 MED ORDER — FENTANYL CITRATE (PF) 100 MCG/2ML IJ SOLN
25.0000 ug | INTRAMUSCULAR | Status: DC | PRN
  Administered 2018-07-08: 50 ug via INTRAVENOUS

## 2018-07-08 MED ORDER — BELLADONNA ALKALOIDS-OPIUM 16.2-60 MG RE SUPP
1.0000 | Freq: Four times a day (QID) | RECTAL | Status: DC | PRN
  Administered 2018-07-08: 1 via RECTAL
  Filled 2018-07-08 (×2): qty 1

## 2018-07-08 MED ORDER — FENTANYL CITRATE (PF) 100 MCG/2ML IJ SOLN
INTRAMUSCULAR | Status: AC
  Filled 2018-07-08: qty 2

## 2018-07-08 MED ORDER — ONDANSETRON HCL 4 MG/2ML IJ SOLN: 4.0000 mg | Freq: Once | INTRAMUSCULAR | Status: DC | PRN

## 2018-07-08 MED ORDER — PHENYLEPHRINE HCL 10 MG/ML IJ SOLN
INTRAMUSCULAR | Status: DC | PRN
  Administered 2018-07-08 (×2): 100 ug via INTRAVENOUS
  Administered 2018-07-08: 50 ug via INTRAVENOUS
  Administered 2018-07-08 (×3): 100 ug via INTRAVENOUS
  Administered 2018-07-08: 200 ug via INTRAVENOUS

## 2018-07-08 MED ORDER — LIDOCAINE HCL (PF) 2 % IJ SOLN
INTRAMUSCULAR | Status: AC
  Filled 2018-07-08: qty 10

## 2018-07-08 MED ORDER — FAMOTIDINE 20 MG PO TABS
ORAL_TABLET | ORAL | Status: AC
  Filled 2018-07-08: qty 1

## 2018-07-08 MED ORDER — DEXAMETHASONE SODIUM PHOSPHATE 10 MG/ML IJ SOLN
INTRAMUSCULAR | Status: DC | PRN
  Administered 2018-07-08: 10 mg via INTRAVENOUS

## 2018-07-08 MED ORDER — SODIUM CHLORIDE 0.9 % IR SOLN
Freq: Once | Status: DC
  Filled 2018-07-08: qty 2

## 2018-07-08 MED ORDER — ROCURONIUM BROMIDE 50 MG/5ML IV SOLN
INTRAVENOUS | Status: AC
  Filled 2018-07-08: qty 1

## 2018-07-08 MED ORDER — SODIUM CHLORIDE 0.9 % IV SOLN
1.5000 g | Freq: Once | INTRAVENOUS | Status: DC
  Filled 2018-07-08: qty 1.5

## 2018-07-08 MED ORDER — DEXAMETHASONE SODIUM PHOSPHATE 10 MG/ML IJ SOLN
INTRAMUSCULAR | Status: AC
  Filled 2018-07-08: qty 1

## 2018-07-08 MED ORDER — CIPROFLOXACIN IN D5W 400 MG/200ML IV SOLN: 400.0000 mg | INTRAVENOUS | Status: DC

## 2018-07-08 MED ORDER — CIPROFLOXACIN IN D5W 400 MG/200ML IV SOLN
INTRAVENOUS | Status: AC
  Filled 2018-07-08: qty 200

## 2018-07-08 MED ORDER — SODIUM CHLORIDE 0.9 % IV SOLN
INTRAVENOUS | Status: DC | PRN
  Administered 2018-07-08: 1.5 g via INTRAVENOUS

## 2018-07-08 MED ORDER — ROCURONIUM BROMIDE 100 MG/10ML IV SOLN
INTRAVENOUS | Status: DC | PRN
  Administered 2018-07-08 (×2): 10 mg via INTRAVENOUS
  Administered 2018-07-08: 30 mg via INTRAVENOUS

## 2018-07-08 MED ORDER — HYDROCODONE-ACETAMINOPHEN 5-325 MG PO TABS: 1.0000 | ORAL_TABLET | ORAL | 0 refills | Status: AC | PRN

## 2018-07-08 MED ORDER — SUGAMMADEX SODIUM 200 MG/2ML IV SOLN
INTRAVENOUS | Status: AC
  Filled 2018-07-08: qty 2

## 2018-07-08 MED ORDER — LACTATED RINGERS IV SOLN
INTRAVENOUS | Status: DC
  Administered 2018-07-08: 12:00:00 via INTRAVENOUS
  Administered 2018-07-08: 125 mL/h via INTRAVENOUS

## 2018-07-08 MED ORDER — SODIUM CHLORIDE 0.9 % IV SOLN
INTRAVENOUS | Status: DC | PRN
  Administered 2018-07-08: 15 ug/min via INTRAVENOUS

## 2018-07-08 MED ORDER — PROPOFOL 10 MG/ML IV BOLUS
INTRAVENOUS | Status: DC | PRN
  Administered 2018-07-08: 30 mg via INTRAVENOUS
  Administered 2018-07-08: 150 mg via INTRAVENOUS

## 2018-07-08 MED ORDER — ONDANSETRON HCL 4 MG/2ML IJ SOLN
INTRAMUSCULAR | Status: DC | PRN
  Administered 2018-07-08: 4 mg via INTRAVENOUS

## 2018-07-08 MED ORDER — MIDAZOLAM HCL 2 MG/2ML IJ SOLN
INTRAMUSCULAR | Status: DC | PRN
  Administered 2018-07-08: 2 mg via INTRAVENOUS

## 2018-07-08 MED ORDER — GENTAMICIN SULFATE 40 MG/ML IJ SOLN
INTRAMUSCULAR | Status: AC
  Filled 2018-07-08: qty 2

## 2018-07-08 MED ORDER — BELLADONNA ALKALOIDS-OPIUM 16.2-60 MG RE SUPP
RECTAL | Status: AC
  Filled 2018-07-08: qty 1

## 2018-07-08 MED ORDER — FAMOTIDINE 20 MG PO TABS
20.0000 mg | ORAL_TABLET | Freq: Once | ORAL | Status: AC
  Administered 2018-07-08: 20 mg via ORAL

## 2018-07-08 MED ORDER — SODIUM CHLORIDE 0.9 % IV SOLN
INTRAVENOUS | Status: DC | PRN
  Administered 2018-07-08: 80 mg via INTRAVENOUS

## 2018-07-08 MED ORDER — BELLADONNA ALKALOIDS-OPIUM 16.2-60 MG RE SUPP: 1.0000 | Freq: Every day | RECTAL | Status: DC

## 2018-07-08 MED ORDER — SUGAMMADEX SODIUM 200 MG/2ML IV SOLN
INTRAVENOUS | Status: DC | PRN
  Administered 2018-07-08: 200 mg via INTRAVENOUS

## 2018-07-08 MED ORDER — SUCCINYLCHOLINE CHLORIDE 20 MG/ML IJ SOLN
INTRAMUSCULAR | Status: DC | PRN
  Administered 2018-07-08: 100 mg via INTRAVENOUS

## 2018-07-08 MED ORDER — MIDAZOLAM HCL 2 MG/2ML IJ SOLN
INTRAMUSCULAR | Status: AC
  Filled 2018-07-08: qty 2

## 2018-07-08 SURGICAL SUPPLY — 35 items
ADAPTER IRRIG TUBE 2 SPIKE SOL (ADAPTER) ×6 IMPLANT
BAG URINE DRAINAGE (UROLOGICAL SUPPLIES) IMPLANT
BAG URO DRAIN 4000ML (MISCELLANEOUS) IMPLANT
CATH FOL 2WAY LX 20X30 (CATHETERS) IMPLANT
CATH FOL 2WAY LX 22X30 (CATHETERS) IMPLANT
CATH FOLEY 3WAY 30CC 22FR (CATHETERS) IMPLANT
CATH FOLEY 3WAY 30CC 24FR (CATHETERS) ×2
CATH URETL 5X70 OPEN END (CATHETERS) ×3 IMPLANT
CATH URTH STD 24FR FL 3W 2 (CATHETERS) IMPLANT
CONTAINER COLLECT MORCELLATR (MISCELLANEOUS) ×1 IMPLANT
DRAPE SHEET LG 3/4 BI-LAMINATE (DRAPES) ×3 IMPLANT
DRAPE UTILITY 15X26 TOWEL STRL (DRAPES) IMPLANT
FILTER OVERFLOW MORCELLATOR (FILTER) ×1 IMPLANT
GLOVE BIOGEL PI IND STRL 7.5 (GLOVE) ×1 IMPLANT
GLOVE BIOGEL PI INDICATOR 7.5 (GLOVE) ×2
GOWN STRL REUS W/ TWL LRG LVL3 (GOWN DISPOSABLE) ×1 IMPLANT
GOWN STRL REUS W/ TWL XL LVL3 (GOWN DISPOSABLE) ×1 IMPLANT
GOWN STRL REUS W/TWL LRG LVL3 (GOWN DISPOSABLE) ×2
GOWN STRL REUS W/TWL XL LVL3 (GOWN DISPOSABLE) ×2
GUIDEWIRE STR DUAL SENSOR (WIRE) IMPLANT
HOLDER FOLEY CATH W/STRAP (MISCELLANEOUS) ×3 IMPLANT
KIT TURNOVER CYSTO (KITS) ×3 IMPLANT
LASER FIBER 550M SMARTSCOPE (Laser) ×3 IMPLANT
MORCELLATOR COLLECT CONTAINER (MISCELLANEOUS) ×3
MORCELLATOR OVERFLOW FILTER (FILTER) ×3
MORCELLATOR ROTATION 4.75 335 (MISCELLANEOUS) ×3 IMPLANT
PACK CYSTO AR (MISCELLANEOUS) ×3 IMPLANT
SET CYSTO W/LG BORE CLAMP LF (SET/KITS/TRAYS/PACK) ×3 IMPLANT
SET IRRIG Y TYPE TUR BLADDER L (SET/KITS/TRAYS/PACK) ×3 IMPLANT
SLEEVE PROTECTION STRL DISP (MISCELLANEOUS) ×6 IMPLANT
SOL .9 NS 3000ML IRR  AL (IV SOLUTION) ×14
SOL .9 NS 3000ML IRR UROMATIC (IV SOLUTION) ×4 IMPLANT
SYRINGE IRR TOOMEY STRL 70CC (SYRINGE) ×3 IMPLANT
TUBE PUMP MORCELLATOR PIRANHA (TUBING) ×3 IMPLANT
WATER STERILE IRR 1000ML POUR (IV SOLUTION) ×3 IMPLANT

## 2018-07-08 NOTE — Anesthesia Post-op Follow-up Note (Signed)
Anesthesia QCDR form completed.        

## 2018-07-08 NOTE — Discharge Instructions (Signed)

## 2018-07-08 NOTE — Op Note (Signed)
Date of procedure: 07/08/18  Preoperative diagnosis:  1. BPH with urinary retention and obstruction  Postoperative diagnosis:  1. Same  Procedure: 1. HoLEP  Surgeon: Legrand Rams, MD  Anesthesia: General  Complications: None  Intraoperative findings:  1.  Large prostate with lateral lobe hypertrophy, high bladder neck 2.  Mild bladder trabeculations, ureteral orifices orthotopic bilaterally 3.  Uncomplicated HOLEP, excellent hemostasis  EBL: Minimal  Specimens: Prostate tissue  Enucleation time: 63 minutes  Morcellation time: 7 minutes  Drains: 24 French three-way, 60 cc in balloon  Indication: Wesley Barnes is a 68 y.o. patient with urinary retention that failed multiple voiding trials despite maximal medical therapy.  Urodynamics showed a functional bladder with outlet obstruction.  Prostate measured 90 g on CT scan.  After reviewing the management options for treatment, they elected to proceed with the above surgical procedure(s). We have discussed the potential benefits and risks of the procedure, side effects of the proposed treatment, the likelihood of the patient achieving the goals of the procedure, and any potential problems that might occur during the procedure or recuperation.  We specifically discussed the risks of bleeding, infection, hematuria and clot retention, need for additional procedures, possible overnight hospital stay, temporary urgency and urge incontinence, and retrograde ejaculation.  Informed consent has been obtained.   Description of procedure:  The patient was taken to the operating room and general anesthesia was induced.  The patient was placed in the dorsal lithotomy position, prepped and draped in the usual sterile fashion, and preoperative antibiotics(ampicillin and gentamicin) were administered.  SCDs were placed for DVT prophylaxis.  A preoperative time-out was performed.   The 64 French continuous flow resectoscope was inserted into the  urethra using the visual obturator  The prostate was large with lateral lobe hypertrophy and a high bladder neck. The bladder was thoroughly inspected and noted to have mild bladder trabeculations.  The ureteral orifices were located in orthotopic position.  The laser was set to 2 J and 50 Hz and was used to make an incision at the 6 o'clock position to the level of the capsule from the bladder neck to the verumontanum.  The lateral lobes were then incised circumferentially until they were disconnected from the surrounding tissue.  The capsule was examined and laser was used for meticulous hemostasis.  The 44 French resectoscope was then switched out for the 26 French nephroscope and the lobes were morcellated and the tissue sent to pathology.  A 24 French three-way catheter was inserted easily with only a slight catch at the bladder neck, and CBI was initiated.  60 cc were placed in the balloon.  Urine was faint pink.  The patient tolerated the procedure well without any immediate complications and was extubated and transferred to the recovery room in stable condition.  Urine was faint pink on fast CBI.  Disposition: Stable to PACU  Plan: Wean CBI in PACU, likely home today with catheter in place, void trial Monday  Legrand Rams, MD

## 2018-07-08 NOTE — Transfer of Care (Signed)
Immediate Anesthesia Transfer of Care Note  Patient: Wesley Barnes  Procedure(s) Performed: HOLEP-LASER ENUCLEATION OF THE PROSTATE WITH MORCELLATION (N/A )  Patient Location: PACU  Anesthesia Type:General  Level of Consciousness: awake, alert  and oriented  Airway & Oxygen Therapy: Patient Spontanous Breathing and Patient connected to face mask oxygen  Post-op Assessment: Report given to RN and Post -op Vital signs reviewed and stable  Post vital signs: Reviewed and stable  Last Vitals:  Vitals Value Taken Time  BP 135/82 07/08/2018 12:49 PM  Temp    Pulse 84 07/08/2018 12:50 PM  Resp 20 07/08/2018 12:50 PM  SpO2 100 % 07/08/2018 12:50 PM  Vitals shown include unvalidated device data.  Last Pain:  Vitals:   07/08/18 1248  TempSrc: Oral  PainSc:       Patients Stated Pain Goal: 0 (07/08/18 0930)  Complications: No apparent anesthesia complications

## 2018-07-08 NOTE — Anesthesia Preprocedure Evaluation (Signed)
Anesthesia Evaluation  Patient identified by MRN, date of birth, ID band Patient awake    Reviewed: Allergy & Precautions, NPO status , Patient's Chart, lab work & pertinent test results  Airway Mallampati: II  TM Distance: >3 FB     Dental   Pulmonary former smoker,  Sarcoid of lung   Pulmonary exam normal        Cardiovascular hypertension, Normal cardiovascular exam     Neuro/Psych negative neurological ROS  negative psych ROS   GI/Hepatic negative GI ROS, Neg liver ROS,   Endo/Other  negative endocrine ROS  Renal/GU Renal disease  negative genitourinary   Musculoskeletal  (+) Arthritis , Osteoarthritis,    Abdominal Normal abdominal exam  (+)   Peds negative pediatric ROS (+)  Hematology negative hematology ROS (+)   Anesthesia Other Findings   Reproductive/Obstetrics                             Anesthesia Physical Anesthesia Plan  ASA: III  Anesthesia Plan: General   Post-op Pain Management:    Induction: Intravenous  PONV Risk Score and Plan:   Airway Management Planned: Oral ETT  Additional Equipment:   Intra-op Plan:   Post-operative Plan: Extubation in OR  Informed Consent: I have reviewed the patients History and Physical, chart, labs and discussed the procedure including the risks, benefits and alternatives for the proposed anesthesia with the patient or authorized representative who has indicated his/her understanding and acceptance.     Dental advisory given  Plan Discussed with: CRNA and Surgeon  Anesthesia Plan Comments:         Anesthesia Quick Evaluation

## 2018-07-08 NOTE — Progress Notes (Signed)
Dr. Richardo Hanks by to check on pt and how CBI was going.  Will dc home this afternoon with Foley in place with leg bag and rtc on Monday for removal.  Also, consulted Dr. Providence Lanius (anesthesia) if a neb tx should be given for pt's wheezing.  Pt is in NAD, and states his breathing feels like it always does and he can use a tx later at home if he needs one.  No new orders given.  Lurene Shadow, RN

## 2018-07-08 NOTE — H&P (Addendum)
UROLOGY H&P UPDATE  Agree with prior H&P dated 06/28/2018.  Foley dependent urinary retention, 90 g prostate on CT and ultrasound.  Urodynamics shows a functional bladder with high pressure low flow voiding consistent with bladder outlet obstruction.  PSA 4.4 previously, and he refused prostate biopsy.  Cardiac: RRR Lungs: CTA bilaterally  Laterality: n/a Procedure: HOLEP  Urine: Urine culture 2/7 with staph epidermidis, has been on culture appropriate Bactrim, and catheter exchanged in clinic 07/05/2018  Informed consent obtained, we specifically discussed the risks of bleeding, infection, post-operative pain, need for additional procedures, need for temporary Foley placement, temporary urgency and urge incontinence, low risk of long-term urinary incontinence, retrograde ejaculation, and bladder spasms.  We also discussed the risks of avoiding prostate biopsy prior to undergoing HOLEP.  [name], MD 07/08/2018

## 2018-07-08 NOTE — Anesthesia Procedure Notes (Signed)
Procedure Name: Intubation Date/Time: 07/08/2018 10:58 AM Performed by: Yves Dill, MD Pre-anesthesia Checklist: Patient identified, Emergency Drugs available, Suction available, Patient being monitored and Timeout performed Patient Re-evaluated:Patient Re-evaluated prior to induction Oxygen Delivery Method: Circle system utilized Preoxygenation: Pre-oxygenation with 100% oxygen Induction Type: IV induction Ventilation: Mask ventilation without difficulty Laryngoscope Size: McGraph and 4 Grade View: Grade II Tube type: Oral Tube size: 7.5 mm Number of attempts: 1 Airway Equipment and Method: Stylet Placement Confirmation: ETT inserted through vocal cords under direct vision Secured at: 21 cm Tube secured with: Tape Dental Injury: Teeth and Oropharynx as per pre-operative assessment

## 2018-07-10 ENCOUNTER — Other Ambulatory Visit: Payer: Self-pay

## 2018-07-10 ENCOUNTER — Emergency Department
Admission: EM | Admit: 2018-07-10 | Discharge: 2018-07-10 | Disposition: A | Discharge status: Home / Self Care | Payer: Medicare Other | Attending: Emergency Medicine | Admitting: Emergency Medicine

## 2018-07-10 ENCOUNTER — Encounter: Payer: Self-pay | Admitting: Emergency Medicine

## 2018-07-10 DIAGNOSIS — R301 Vesical tenesmus: Secondary | ICD-10-CM | POA: Diagnosis not present

## 2018-07-10 DIAGNOSIS — Z79899 Other long term (current) drug therapy: Secondary | ICD-10-CM | POA: Insufficient documentation

## 2018-07-10 DIAGNOSIS — Z7982 Long term (current) use of aspirin: Secondary | ICD-10-CM | POA: Insufficient documentation

## 2018-07-10 DIAGNOSIS — T83091A Other mechanical complication of indwelling urethral catheter, initial encounter: Secondary | ICD-10-CM | POA: Insufficient documentation

## 2018-07-10 DIAGNOSIS — T839XXA Unspecified complication of genitourinary prosthetic device, implant and graft, initial encounter: Secondary | ICD-10-CM

## 2018-07-10 DIAGNOSIS — Y658 Other specified misadventures during surgical and medical care: Secondary | ICD-10-CM | POA: Diagnosis not present

## 2018-07-10 DIAGNOSIS — R319 Hematuria, unspecified: Secondary | ICD-10-CM | POA: Insufficient documentation

## 2018-07-10 DIAGNOSIS — Z87891 Personal history of nicotine dependence: Secondary | ICD-10-CM | POA: Insufficient documentation

## 2018-07-10 DIAGNOSIS — N3289 Other specified disorders of bladder: Secondary | ICD-10-CM

## 2018-07-10 LAB — CBC WITH DIFFERENTIAL/PLATELET
ABS IMMATURE GRANULOCYTES: 0.03 10*3/uL (ref 0.00–0.07)
Basophils Absolute: 0 10*3/uL (ref 0.0–0.1)
Basophils Relative: 0 %
Eosinophils Absolute: 0.1 10*3/uL (ref 0.0–0.5)
Eosinophils Relative: 1 %
HCT: 37.8 % — ABNORMAL LOW (ref 39.0–52.0)
Hemoglobin: 12.6 g/dL — ABNORMAL LOW (ref 13.0–17.0)
Immature Granulocytes: 0 %
Lymphocytes Relative: 21 %
Lymphs Abs: 1.5 10*3/uL (ref 0.7–4.0)
MCH: 30.1 pg (ref 26.0–34.0)
MCHC: 33.3 g/dL (ref 30.0–36.0)
MCV: 90.2 fL (ref 80.0–100.0)
Monocytes Absolute: 0.8 10*3/uL (ref 0.1–1.0)
Monocytes Relative: 11 %
NEUTROS ABS: 4.8 10*3/uL (ref 1.7–7.7)
NEUTROS PCT: 67 %
Platelets: 210 10*3/uL (ref 150–400)
RBC: 4.19 MIL/uL — AB (ref 4.22–5.81)
RDW: 13.9 % (ref 11.5–15.5)
WBC: 7.3 10*3/uL (ref 4.0–10.5)
nRBC: 0 % (ref 0.0–0.2)

## 2018-07-10 LAB — BASIC METABOLIC PANEL
Anion gap: 7 (ref 5–15)
BUN: 21 mg/dL (ref 8–23)
CO2: 24 mmol/L (ref 22–32)
Calcium: 9 mg/dL (ref 8.9–10.3)
Chloride: 104 mmol/L (ref 98–111)
Creatinine, Ser: 0.95 mg/dL (ref 0.61–1.24)
GFR calc Af Amer: >60 mL/min (ref >60)
Glucose, Bld: 101 mg/dL — ABNORMAL HIGH (ref 70–99)
Potassium: 3.7 mmol/L (ref 3.5–5.1)
SODIUM: 135 mmol/L (ref 135–145)

## 2018-07-10 MED ORDER — OXYBUTYNIN CHLORIDE 5 MG PO TABS
5.0000 mg | ORAL_TABLET | Freq: Once | ORAL | Status: AC
  Administered 2018-07-10: 5 mg via ORAL
  Filled 2018-07-10: qty 1

## 2018-07-10 MED ORDER — OXYBUTYNIN CHLORIDE 5 MG PO TABS: 5.0000 mg | ORAL_TABLET | Freq: Three times a day (TID) | ORAL | 0 refills | Status: DC | PRN

## 2018-07-10 NOTE — ED Notes (Signed)
PER PT:  Balloon holding foley in place has >23mls, PLEASE FULLY DEFLATE BALLOON prior to removal

## 2018-07-10 NOTE — Discharge Instructions (Addendum)
1.  Take Ditropan 3 times daily as needed for bladder discomfort/spasms. 2.  Irrigate catheter as instructed. 3.  Return to the ER for worsening symptoms, persistent vomiting, fever or other concerns.

## 2018-07-10 NOTE — ED Provider Notes (Signed)
[facility] Emergency Department Provider Note   ____________________________________________   First MD Initiated Contact with Patient 07/10/18 [phone]     (approximate)  I have reviewed the triage vital signs and the nursing notes.   HISTORY  Chief Complaint Urinary Retention and Hematuria    HPI Wesley Barnes is a 68 y.o. male who presents to the ED from home with an indwelling Foley catheter in place with complaints of no urine flow into the catheter since 1 AM and blood clots prior to that time.  Patient has the Foley catheter for urinary retention.  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 Richardo Hanks.  Was on Bactrim for 4 days leading up to the procedure.  Catheter was exchanged after the procedure on 2/14//2020 for 55F 3 way.  Denies use of anticoagulants.  Denies associated fever, chills, chest pain, shortness of breath, nausea or vomiting.  Had some numbness in his right anterior thigh last evening which has since resolved.   Past Medical History:  Diagnosis Date  . BPH (benign prostatic hyperplasia)   . Hypertension   . Pneumonia 05/2018  . 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 AKI (acute kidney injury) (HCC) 06/09/2018  . Hypertension 01/18/2018  . Sarcoidosis 12/16/2016  . 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 Nocturia 12/21/2015  . Osteoarthritis of right hip 07/29/2015  . 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 Hearing loss of both ears 10/02/2014    Past Surgical History:  Procedure Laterality Date  . COLONOSCOPY  2000  . HERNIA REPAIR Right 2001   inguinal  . HIP SURGERY Right    2016  . HOLEP-LASER ENUCLEATION OF THE PROSTATE WITH MORCELLATION N/A 07/08/2018   Procedure: HOLEP-LASER ENUCLEATION OF THE PROSTATE WITH MORCELLATION;  Surgeon: [name], MD;   Location: [facility] ORS;  Service: Urology;  Laterality: N/A;  . JOINT REPLACEMENT Bilateral 2017, 2019   THR    Prior to Admission medications   Medication Sig Start Date End Date Taking? Authorizing Provider  aspirin EC 81 MG tablet Take 81 mg by mouth daily.  07/30/15   [provider]  HYDROcodone-acetaminophen (NORCO/VICODIN) 5-325 MG tablet Take 1-2 tablets by mouth every 4 (four) hours as needed for up to 5 days for moderate pain. 07/08/18 07/13/18  [name], MD  ketoconazole (NIZORAL) 2 % cream Apply 1 application topically daily. 06/06/18   [provider]  lisinopril (PRINIVIL,ZESTRIL) 10 MG tablet Take 10 mg by mouth daily.  06/08/16   [provider]  Multiple Vitamin (MULTIVITAMIN) capsule Take 1 capsule by mouth daily.     [provider]  oxybutynin (DITROPAN) 5 MG tablet Take 1 tablet (5 mg total) by mouth every 8 (eight) hours as needed for bladder spasms. 07/10/18   Irean Hong, MD  sulfamethoxazole-trimethoprim (BACTRIM DS,SEPTRA DS) 800-160 MG tablet Take 1 tablet by mouth every 12 (twelve) hours. 07/05/18   [name], MD    Allergies Patient has no known allergies.  History reviewed. No pertinent family history.  Social History Social History   Tobacco Use  . Smoking status: Former Smoker    Packs/day: 0.25    Types: Cigarettes    Last attempt to quit: 2001    Years since quitting: 19.1  . Smokeless tobacco: Never Used  Substance  Use Topics  . Alcohol use: Yes    Comment: occ  . Drug use: No    Review of Systems  Constitutional: No fever/chills Eyes: No visual changes. ENT: No sore throat. Cardiovascular: Denies chest pain. Respiratory: Denies shortness of breath. Gastrointestinal: Positive for abdominal pain.  No nausea, no vomiting.  No diarrhea.  No constipation. Genitourinary: Positive for no flow of urine into the catheter bag.  Negative for dysuria. Musculoskeletal: Negative for back pain. Skin: Negative  for rash. Neurological: Negative for headaches, focal weakness or numbness.   ____________________________________________   PHYSICAL EXAM:  VITAL SIGNS: ED Triage Vitals [07/10/18 0343]  Enc Vitals Group     BP (!) 160/98     Pulse Rate 97     Resp 20     Temp 97.9 F (36.6 C)     Temp Source Oral     SpO2 99 %     Weight 170 lb (77.1 kg)     Height 5\' 7"  (1.702 m)     Head Circumference      Peak Flow      Pain Score 2     Pain Loc      Pain Edu?      Excl. in GC?     Constitutional: Alert and oriented. Well appearing and in no acute distress. Eyes: Conjunctivae are normal. PERRL. EOMI. Head: Atraumatic. Nose: No congestion/rhinnorhea. Mouth/Throat: Mucous membranes are moist.  Oropharynx non-erythematous. Neck: No stridor.   Cardiovascular: Normal rate, regular rhythm. Grossly normal heart sounds.  Good peripheral circulation. Respiratory: Normal respiratory effort.  No retractions. Lungs CTAB. Gastrointestinal: Soft and nontender to light or deep palpation. No distention. No abdominal bruits. No CVA tenderness. Genitourinary: No gross hematuria from Foley insertion site. Musculoskeletal: No lower extremity tenderness nor edema.  No joint effusions. Neurologic:  Normal speech and language. No gross focal neurologic deficits are appreciated. No gait instability. Skin:  Skin is warm, dry and intact. No rash noted. Psychiatric: Mood and affect are normal. Speech and behavior are normal.  ____________________________________________   LABS (all labs ordered are listed, but only abnormal results are displayed)  Labs Reviewed  CBC WITH DIFFERENTIAL/PLATELET - Abnormal; Notable for the following components:      Result Value   RBC 4.19 (*)    Hemoglobin 12.6 (*)    HCT 37.8 (*)    All other components within normal limits  BASIC METABOLIC PANEL - Abnormal; Notable for the following components:   Glucose, Bld 101 (*)    All other components within normal limits    ____________________________________________  EKG  None ____________________________________________  RADIOLOGY  ED MD interpretation: None  Official radiology report(s): No results found.  ____________________________________________   PROCEDURES  Procedure(s) performed: None  Procedures  Critical Care performed: No  ____________________________________________   INITIAL IMPRESSION / ASSESSMENT AND PLAN / ED COURSE  As part of my medical decision making, I reviewed the following data within the electronic MEDICAL RECORD NUMBER History obtained from family, Nursing notes reviewed and incorporated, Labs reviewed, Old chart reviewed and Notes from prior ED visits    68 year old male who presents with indwelling Foley catheter status post Holep laser procedure 2/14 with blood clots and no flow in Foley catheter bag.  Nurse was able to inject and draw out equal amount of saline used to irrigate Foley catheter.  No blood clots noted.  Bladder scan was performed with small amount in the bladder.  Will administer oxybutynin for likely bladder spasms.  Will obtain basic lab work.   Clinical Course as of Jul 11 543  Sun Jul 10, 2018  0528 Updated patient and spouse on laboratory results.  He has a follow-up on Monday with his urologist.  Will discharge home on oxybutynin for bladder spasms.  Strict return precautions given.  Both verbalized understanding and agree with plan of care.   [JS]    Clinical Course User Index [JS] Irean Hong, MD     ____________________________________________   FINAL CLINICAL IMPRESSION(S) / ED DIAGNOSES  Final diagnoses:  Hematuria, unspecified type  Problem with Foley catheter, initial encounter [facility])  Bladder spasms     ED Discharge Orders         Ordered    oxybutynin (DITROPAN) 5 MG tablet  Every 8 hours PRN     07/10/18 0544           Note:  This document was prepared using Dragon voice recognition software and may include  unintentional dictation errors.   Irean Hong, MD 07/10/18 [phone]

## 2018-07-10 NOTE — ED Triage Notes (Signed)
Patient ambulatory to triage from home with complaints of no urine flow into catheter since 0100 and blood clots prior to that.  Pt had "Hole-up" procedure on Friday for BPH.  Constant pressure in abdomen and now c/o of numbness in right leg.     Pt reports taking flow max yesterday am and pt pt does not take blood thinners.   Speaking in complete coherent sentences. No acute breathing distress noted.

## 2018-07-11 ENCOUNTER — Ambulatory Visit (INDEPENDENT_AMBULATORY_CARE_PROVIDER_SITE_OTHER): Payer: Medicare Other | Admitting: Urology

## 2018-07-11 ENCOUNTER — Encounter: Payer: Self-pay | Admitting: Urology

## 2018-07-11 VITALS — BP 120/80 | HR 60 | Ht 67.0 in | Wt 174.0 lb

## 2018-07-11 DIAGNOSIS — Z125 Encounter for screening for malignant neoplasm of prostate: Secondary | ICD-10-CM

## 2018-07-11 DIAGNOSIS — Z4889 Encounter for other specified surgical aftercare: Secondary | ICD-10-CM

## 2018-07-11 LAB — SURGICAL PATHOLOGY

## 2018-07-11 LAB — BLADDER SCAN AMB NON-IMAGING

## 2018-07-11 NOTE — Anesthesia Postprocedure Evaluation (Signed)
Anesthesia Post Note  Patient: Wesley Barnes  Procedure(s) Performed: HOLEP-LASER ENUCLEATION OF THE PROSTATE WITH MORCELLATION (N/A )  Patient location during evaluation: PACU Anesthesia Type: General Level of consciousness: awake and alert Pain management: pain level controlled Vital Signs Assessment: post-procedure vital signs reviewed and stable Respiratory status: spontaneous breathing, nonlabored ventilation, respiratory function stable and patient connected to nasal cannula oxygen Cardiovascular status: blood pressure returned to baseline and stable Postop Assessment: no apparent nausea or vomiting Anesthetic complications: no     Last Vitals:  Vitals:   07/08/18 1345 07/08/18 1401  BP: (!) 147/95 (!) 130/91  Pulse: 88 90  Resp: 17 16  Temp:  36.6 C  SpO2: 97% 100%    Last Pain:  Vitals:   07/08/18 1401  TempSrc: Temporal  PainSc: 0-No pain                 Christia Reading

## 2018-07-11 NOTE — Progress Notes (Signed)
   07/11/2018 2:06 PM   Florestine Avers 1950/11/10 [id number]  Reason for visit: Follow up HOLEP  HPI: I saw Mr. Wesley Barnes in urology clinic today in follow-up after HOLEP on 07/08/2018.  He had previously been in Foley dependent urinary retention, and underwent uncomplicated HOLEP for a 90 g prostate.  Unfortunately, he was seen in the emergency department yesterday with some hematuria and bladder spasms, however the catheter was draining well with low PVR and he was discharged home.  Urine is clear yellow today, the Foley was removed, the patient was able to void pink urine with a PVR of 15 cc.  He return this afternoon, and voided clear yellow urine with a strong stream, and PVR of 16 cc.  He had a mildly elevated PSA of 4.4 preoperatively, and refused prostate biopsy prior to undergoing HOLEP.  Pathology from [facility] showed 52 g of tissue with only stromal and glandular hyperplasia, no prostate cancer seen.  We discussed normal postop expectations at length.  RTC 6 weeks with PVR, PSA prior  A total of 10 minutes were spent face-to-face with the patient, greater than 50% was spent in patient education, counseling, and coordination of care regarding postop expectations after HOLEP, and PSA screening.   [name], MD  [facility] [address], [address] [place], [place] [address] [phone]

## 2018-07-11 NOTE — Patient Instructions (Signed)
Kegel Exercises  Kegel exercises help strengthen the muscles that support the rectum, vagina, small intestine, bladder, and uterus. Doing Kegel exercises can help:   Improve bladder and bowel control.   Improve sexual response.   Reduce problems and discomfort during pregnancy.  Kegel exercises involve squeezing your pelvic floor muscles, which are the same muscles you squeeze when you try to stop the flow of urine. The exercises can be done while sitting, standing, or lying down, but it is best to vary your position.  Exercises  1. Squeeze your pelvic floor muscles tight. You should feel a tight lift in your rectal area. If you are a male, you should also feel a tightness in your vaginal area. Keep your stomach, buttocks, and legs relaxed.  2. Hold the muscles tight for up to 10 seconds.  3. Relax your muscles.  Repeat this exercise 50 times a day or as many times as told by your health care provider. Continue to do this exercise for at least 4-6 weeks or for as long as told by your health care provider.  This information is not intended to replace advice given to you by your health care provider. Make sure you discuss any questions you have with your health care provider.  Document Released: 04/27/2012 Document Revised: 09/21/2016 Document Reviewed: 03/31/2015  Elsevier Interactive Patient Education  2019 [employer]

## 2018-07-15 ENCOUNTER — Encounter: Payer: Self-pay | Admitting: Urology

## 2018-07-22 ENCOUNTER — Ambulatory Visit: Payer: Medicare Other | Admitting: Urology

## 2018-07-27 NOTE — Telephone Encounter (Signed)
ERROR

## 2018-08-23 ENCOUNTER — Ambulatory Visit: Payer: Medicare Other | Admitting: Urology

## 2018-08-23 ENCOUNTER — Telehealth (INDEPENDENT_AMBULATORY_CARE_PROVIDER_SITE_OTHER): Payer: Medicare Other | Admitting: Urology

## 2018-08-23 ENCOUNTER — Other Ambulatory Visit: Payer: Self-pay

## 2018-08-23 DIAGNOSIS — N401 Enlarged prostate with lower urinary tract symptoms: Secondary | ICD-10-CM | POA: Diagnosis not present

## 2018-08-23 DIAGNOSIS — R339 Retention of urine, unspecified: Secondary | ICD-10-CM | POA: Diagnosis not present

## 2018-08-23 NOTE — Progress Notes (Signed)
Virtual Visit via Telephone Note  I connected with Wesley Barnes on 08/23/18 at 11:00 AM EDT by telephone and verified that I am speaking with the correct person using two identifiers.   I discussed the limitations, risks, security and privacy concerns of performing an evaluation and management service by telephone and the availability of in person appointments. We discussed the impact of the COVID-19 on the healthcare system, and the importance of social distancing and reducing patient and provider exposure. I also discussed with the patient that there may be a patient responsible charge related to this service. The patient expressed understanding and agreed to proceed.  Reason for visit: Post op HoLEP  History of Present Illness: Mr. Wesley Barnes is a 68 year old M that originally presented with foley dependent urinary retention. CT showed a 90g gland with upstream hydroureteronephrosis, PSA was 4.4 and he deferred any biopsy prior to intervention. He underwent uncomplicated HoLEP on 07/08/2018 with 52g benign prostatic tissue on pathology. He passed a void trial POD#3 with PVR of 15cc.  Overall, he is very happy with his outcome thus far. He is voiding with a strong urinary stream and denies and sensation of incomplete emptying. He has minimal stress leakage. He denies urgency or urge incontinence. Urine is clear with no hematuria. He describes an odd sensation in the penile urethra occasionally after voiding, but this is minimally bothersome. Denies any flank pain.  Assessment and Plan: 68 yo M s/p HoLEP for 90g gland in foley dependent urinary retention with 52g benign tissue on pathology. Overall doing well with strong urinary stream.    Follow Up: -Continue Kegel exercises -RTC 6-9 months with PVR  I discussed the assessment and treatment plan with the patient. The patient was provided an opportunity to ask questions and all were answered. The patient agreed with the plan and demonstrated an  understanding of the instructions.   The patient was advised to call back or seek an in-person evaluation if the symptoms worsen or if the condition fails to improve as anticipated.  I provided 13 minutes of non-face-to-face time during this encounter.   [name], MD

## 2018-08-24 ENCOUNTER — Ambulatory Visit: Payer: Medicare Other | Admitting: Urology

## 2019-02-23 ENCOUNTER — Ambulatory Visit: Payer: Medicare Other | Admitting: Urology

## 2019-03-23 ENCOUNTER — Ambulatory Visit: Payer: Medicare Other | Admitting: Urology

## 2019-03-24 ENCOUNTER — Other Ambulatory Visit: Payer: Self-pay | Admitting: *Deleted

## 2019-03-24 DIAGNOSIS — Z20822 Contact with and (suspected) exposure to covid-19: Secondary | ICD-10-CM

## 2019-03-25 LAB — NOVEL CORONAVIRUS, NAA: SARS-CoV-2, NAA: NOT DETECTED

## 2019-04-06 ENCOUNTER — Other Ambulatory Visit: Payer: Self-pay

## 2019-04-06 ENCOUNTER — Encounter: Payer: Self-pay | Admitting: Urology

## 2019-04-06 ENCOUNTER — Ambulatory Visit (INDEPENDENT_AMBULATORY_CARE_PROVIDER_SITE_OTHER): Payer: Medicare Other | Admitting: Urology

## 2019-04-06 VITALS — BP 150/92 | HR 117 | Ht 67.0 in | Wt 180.0 lb

## 2019-04-06 DIAGNOSIS — N138 Other obstructive and reflux uropathy: Secondary | ICD-10-CM

## 2019-04-06 DIAGNOSIS — N401 Enlarged prostate with lower urinary tract symptoms: Secondary | ICD-10-CM

## 2019-04-06 DIAGNOSIS — Z4889 Encounter for other specified surgical aftercare: Secondary | ICD-10-CM | POA: Diagnosis not present

## 2019-04-06 LAB — BLADDER SCAN AMB NON-IMAGING

## 2019-04-06 NOTE — Progress Notes (Signed)
   04/06/2019 2:30 PM   Wesley Barnes [birth year] [id number]  Reason for visit: Follow up HOLEP  HPI: I saw Wesley Barnes back in urology clinic for follow-up after HOLEP on 07/08/2018.  He is a 68 year old male who originally presented to the ER in urinary retention with bilateral hydronephrosis and AKI in January 2020.  CT showed a 90 g prostate, PSA was mildly elevated at 4.4 and he deferred biopsy, and he underwent a successful HOLEP in February.  Prostate tissue from surgery showed only BPH.  He has been voiding with a strong stream since that time and denies any complaints.  His nocturia has improved from 5-6 times per night, to 0-1 time per night.  He denies any urgency, frequency, dribbling, gross hematuria, UTIs, or leakage.  He is very happy with his result.  PVR in clinic is 50 mL.  I recommended following up in 6 months with a PVR and PSA.  If doing well at that time can follow-up as needed  A total of 15 minutes were spent face-to-face with the patient, greater than 50% was spent in patient education, counseling, and coordination of care regarding HOLEP and postop expectations.  [place], [facility] [address] [place], [place] [address] [phone]

## 2019-09-27 ENCOUNTER — Other Ambulatory Visit: Payer: Self-pay

## 2019-09-27 DIAGNOSIS — R972 Elevated prostate specific antigen [PSA]: Secondary | ICD-10-CM

## 2019-09-28 ENCOUNTER — Other Ambulatory Visit: Payer: Medicare Other

## 2019-09-28 ENCOUNTER — Other Ambulatory Visit: Payer: Self-pay

## 2019-09-28 DIAGNOSIS — R972 Elevated prostate specific antigen [PSA]: Secondary | ICD-10-CM

## 2019-09-29 LAB — PSA: Prostate Specific Ag, Serum: 0.4 ng/mL (ref 0.0–4.0)

## 2019-10-04 ENCOUNTER — Other Ambulatory Visit: Payer: Self-pay

## 2019-10-04 ENCOUNTER — Ambulatory Visit (INDEPENDENT_AMBULATORY_CARE_PROVIDER_SITE_OTHER): Payer: Medicare Other | Admitting: Urology

## 2019-10-04 VITALS — BP 133/88 | HR 102 | Ht 67.0 in | Wt 180.0 lb

## 2019-10-04 DIAGNOSIS — Z125 Encounter for screening for malignant neoplasm of prostate: Secondary | ICD-10-CM

## 2019-10-04 DIAGNOSIS — N401 Enlarged prostate with lower urinary tract symptoms: Secondary | ICD-10-CM | POA: Diagnosis not present

## 2019-10-04 DIAGNOSIS — N138 Other obstructive and reflux uropathy: Secondary | ICD-10-CM | POA: Diagnosis not present

## 2019-10-04 LAB — BLADDER SCAN AMB NON-IMAGING: Scan Result: 14

## 2019-10-04 NOTE — Progress Notes (Signed)
   10/04/2019 10:41 AM   Wesley Barnes [birth year] [id number]  Reason for visit: Follow up HoLEP, PSA screening  HPI: I saw Wesley Barnes back in urology clinic for follow-up after undergoing a HOLEP for foley dependent urinary retention in February 2020.  He underwent an uncomplicated HoLEP with removal of 52 g of tissue showing only benign prostate.  PSA had been elevated pre-op at 4.4(he refused biopsy pre-HoLEP), and decreased appropriately to 0.4 after undergoing surgery.  He denies any urinary complaints today and is doing very well.  IPSS score today is 3, with quality of life delighted.  PVR is normal at 14 mL.  He denies any gross hematuria, UTIs, or incontinence.  He is moving to [place] for some family issues, and will follow up as needed.  We discussed return precautions or reasons to establish care with a urologist locally including gross hematuria, weakening stream, or UTIs.  Follow-up as needed  [name], MD  [facility] [address] [place], [place] [address] [phone]

## 2019-10-05 ENCOUNTER — Ambulatory Visit: Payer: Medicare Other | Admitting: Urology

## 2020-10-05 IMAGING — CT CT ABD-PELV W/O
2 of 5 series · 15 of 46 positions shown, 17 images · non-contrast
Comparison: 11/19/2013

CLINICAL DATA: Difficulty voiding

EXAM:
CT ABDOMEN AND PELVIS WITHOUT CONTRAST
TECHNIQUE: Multidetector CT imaging of the abdomen and pelvis was performed
following the standard protocol without IV contrast.

[Series 2: routine abd/pel wo · axial · 0.76mm/px · z∈[-757,-347]mm · 12 of 98 slices shown, 14 images]
[im 8/98  soft-tissue]
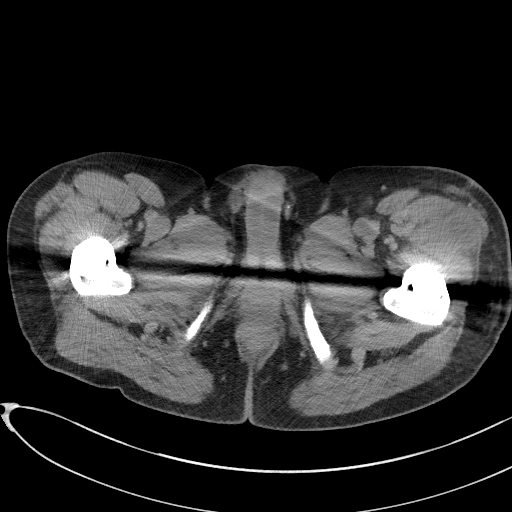
[im 8/98  bone]
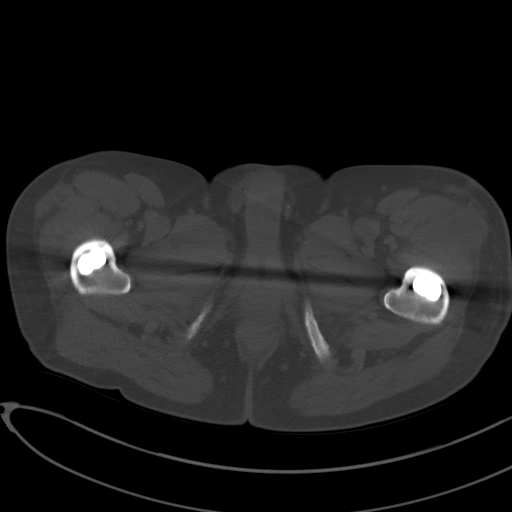
[im 15/98  soft-tissue]
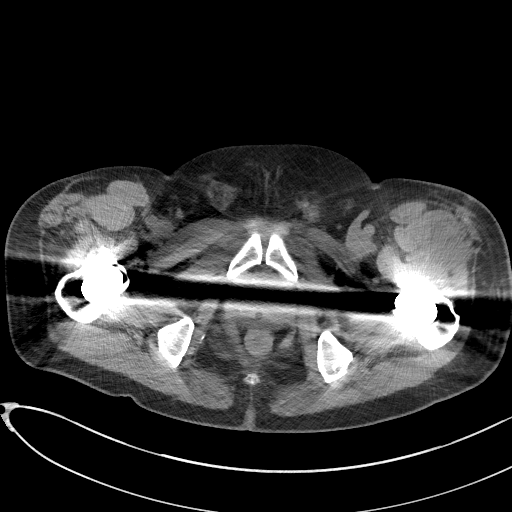
[im 23/98  soft-tissue]
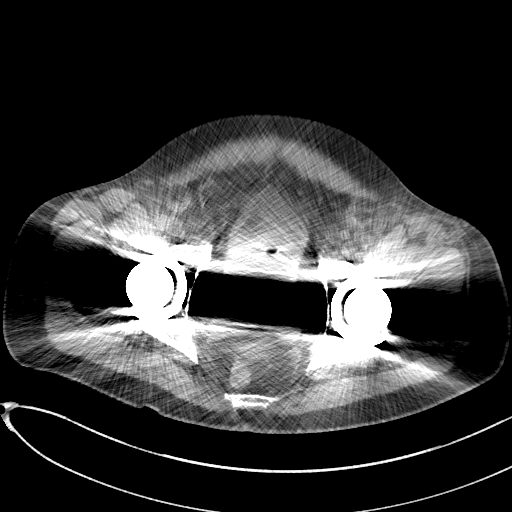
[im 30/98  soft-tissue]
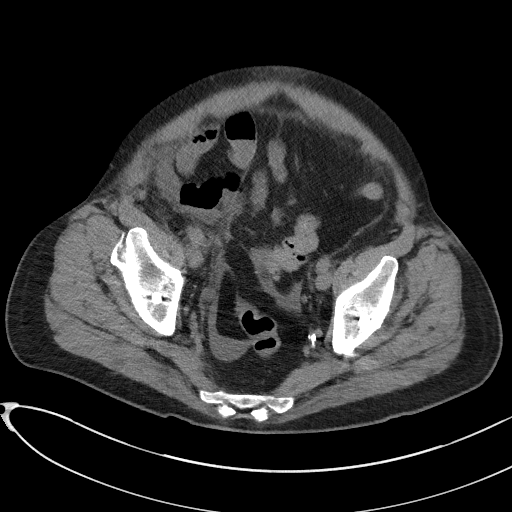
[im 38/98  soft-tissue]
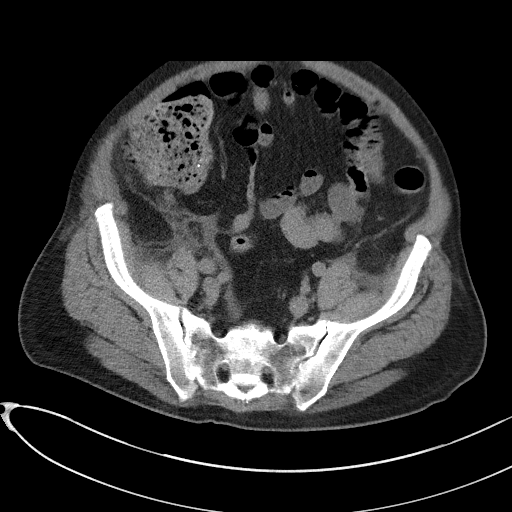
[im 45/98  soft-tissue]
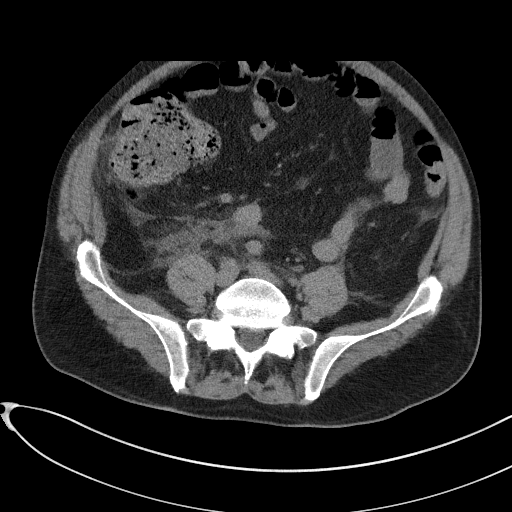
[im 53/98  soft-tissue]
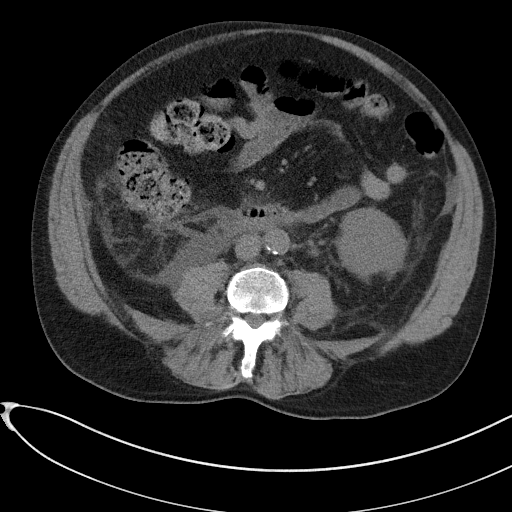
[im 60/98  soft-tissue]
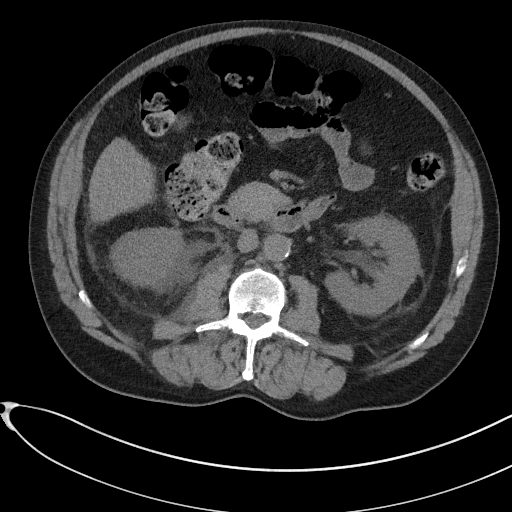
[im 68/98  soft-tissue]
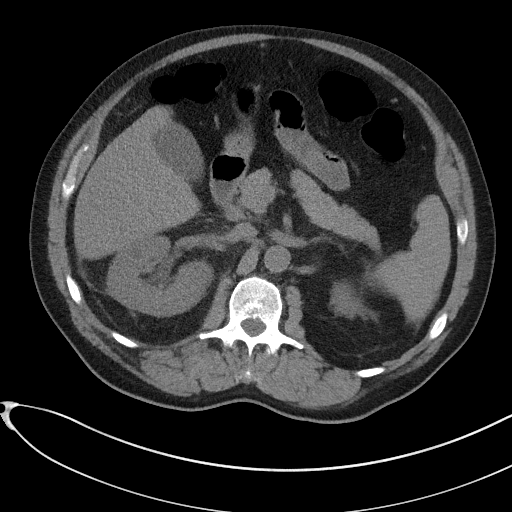
[im 68/98  bone]
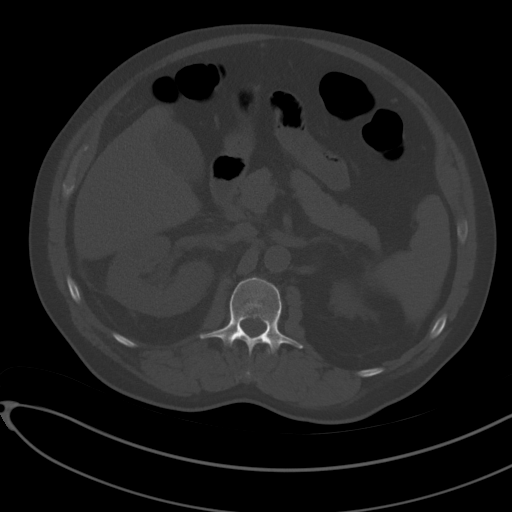
[im 75/98  soft-tissue]
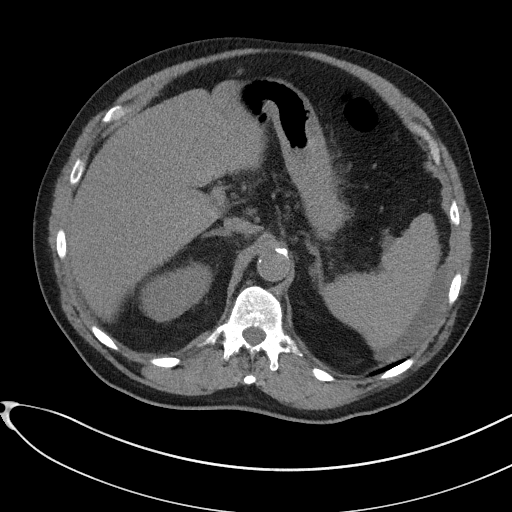
[im 83/98  soft-tissue]
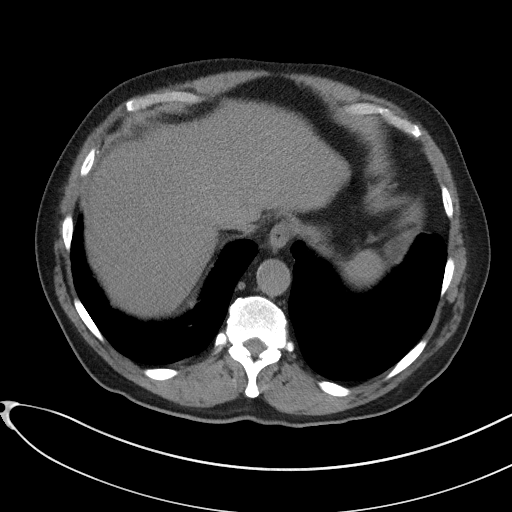
[im 90/98  soft-tissue]
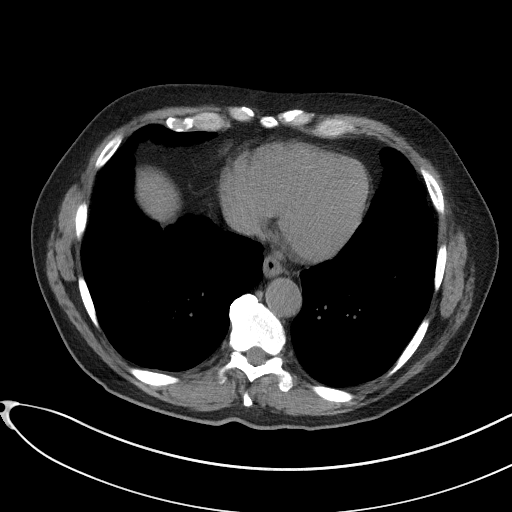

[Series 5: coronal st · coronal · 0.83mm/px · 3 of 107 slices shown]
[im 36/107  soft-tissue]
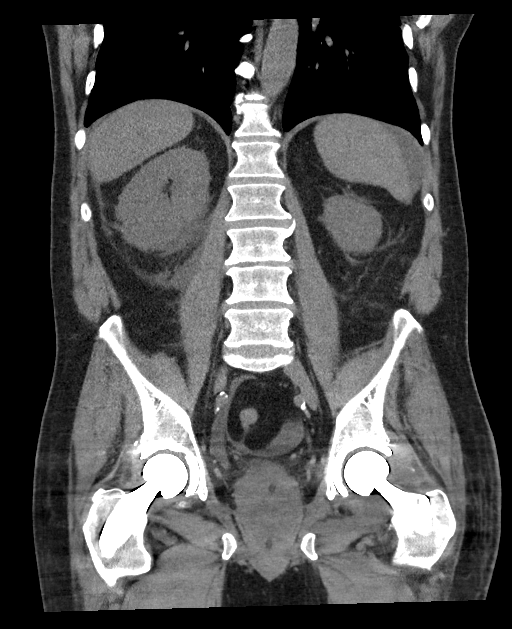
[im 48/107  soft-tissue]
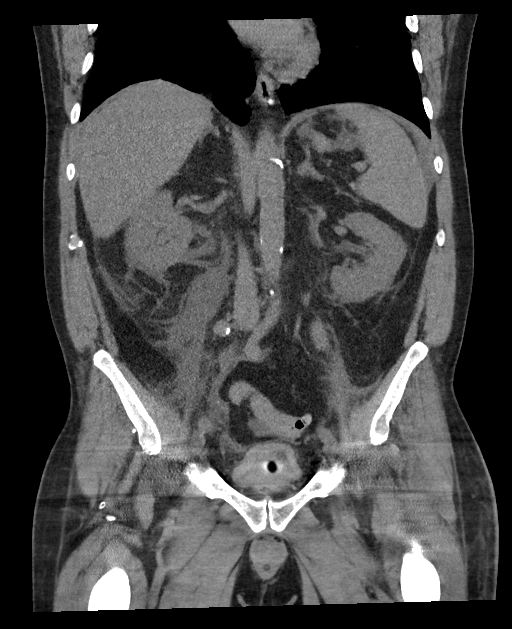
[im 59/107  soft-tissue]
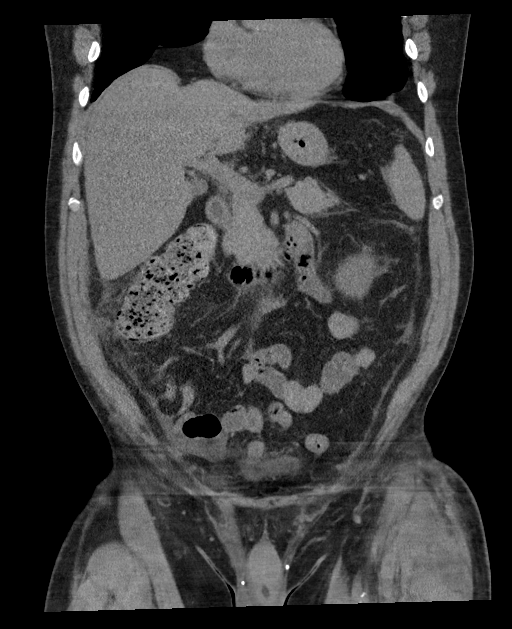

[15 of 46 positions shown; findings below may reference images not displayed]

FINDINGS: Lower chest: No acute abnormality.

Hepatobiliary: No focal liver abnormality is seen. No gallstones,
gallbladder wall thickening, or biliary dilatation.

Pancreas: Unremarkable. No pancreatic ductal dilatation or
surrounding inflammatory changes.

Spleen: Spleen is within normal limits. Mild perisplenic fluid is
noted.

Adrenals/Urinary Tract: Adrenal glands are within normal limits.
Left kidney demonstrates no renal calculi or obstructive changes.
The ureter appears within normal limits. The right kidney
demonstrates some perinephric changes as well as fullness of the
collecting system. Fluid is noted tracking along the course of the
right ureter and kidney towards the bladder. The right ureter is
distended to the level of the urinary bladder. The bladder is
decompressed by Foley catheter.

Stomach/Bowel: No obstructive or inflammatory changes of the bowel
are noted. The appendix is within normal limits. The stomach is
decompressed.

Vascular/Lymphatic: Aortic atherosclerosis. No enlarged abdominal or
pelvic lymph nodes.

Reproductive: Prostate is unremarkable.

Other: Mild free fluid is noted within the pelvis and extending
surrounding the spleen consistent with the changes involving the
right ureter and kidney. This likely represents extravasated urine.

Musculoskeletal: Bilateral hip replacements are seen. Degenerative
changes of lumbar spine are noted.
IMPRESSION: Dilatation of the right ureter and renal collecting system with
evidence of perinephric stranding and Peri-ureteral fluid likely
related to obstructive change with caliceal rupture. An obstructing
stone is not visualized at this time.

No other focal abnormality is noted.
